# Patient Record
Sex: Male | Born: 1935
Health system: Southern US, Community
[De-identification: ages and names within clinical notes are randomized; demographics above are authoritative.]

## PROBLEM LIST (undated history)

## (undated) DIAGNOSIS — H9190 Unspecified hearing loss, unspecified ear: Secondary | ICD-10-CM

## (undated) DIAGNOSIS — M1990 Unspecified osteoarthritis, unspecified site: Secondary | ICD-10-CM

---

## 1998-05-14 ENCOUNTER — Emergency Department (HOSPITAL_COMMUNITY): Admission: EM | Admit: 1998-05-14 | Discharge: 1998-05-14 | Payer: Self-pay | Admitting: Emergency Medicine

## 2000-10-29 ENCOUNTER — Ambulatory Visit (HOSPITAL_COMMUNITY): Admission: RE | Admit: 2000-10-29 | Discharge: 2000-10-29 | Payer: Self-pay | Admitting: Orthopedic Surgery

## 2000-10-29 ENCOUNTER — Encounter: Payer: Self-pay | Admitting: Orthopedic Surgery

## 2003-12-09 ENCOUNTER — Emergency Department (HOSPITAL_COMMUNITY): Admission: EM | Admit: 2003-12-09 | Discharge: 2003-12-09 | Payer: Self-pay | Admitting: *Deleted

## 2005-04-10 ENCOUNTER — Ambulatory Visit (HOSPITAL_COMMUNITY): Admission: RE | Admit: 2005-04-10 | Discharge: 2005-04-10 | Payer: Self-pay | Admitting: Cardiovascular Disease

## 2005-04-11 ENCOUNTER — Emergency Department (HOSPITAL_COMMUNITY): Admission: EM | Admit: 2005-04-11 | Discharge: 2005-04-12 | Payer: Self-pay | Admitting: Emergency Medicine

## 2007-12-27 ENCOUNTER — Emergency Department (HOSPITAL_COMMUNITY): Admission: EM | Admit: 2007-12-27 | Discharge: 2007-12-27 | Payer: Self-pay | Admitting: Emergency Medicine

## 2008-07-01 ENCOUNTER — Ambulatory Visit (HOSPITAL_BASED_OUTPATIENT_CLINIC_OR_DEPARTMENT_OTHER): Admission: RE | Admit: 2008-07-01 | Discharge: 2008-07-01 | Payer: Self-pay | Admitting: Urology

## 2008-07-01 ENCOUNTER — Encounter (INDEPENDENT_AMBULATORY_CARE_PROVIDER_SITE_OTHER): Payer: Self-pay | Admitting: Urology

## 2011-04-06 NOTE — Op Note (Signed)
Thomas Rich, Thomas Rich       ACCOUNT NO.:  [id number]   MEDICAL RECORD NO.:  [id number]          PATIENT TYPE:  AMB   LOCATION:  [facility]                         FACILITY:  [facility]   PHYSICIAN:  Mark C. Vernie Ammons, M.D.  DATE OF BIRTH:  10/11/1936   DATE OF PROCEDURE:  07/01/2008  DATE OF DISCHARGE:                               OPERATIVE REPORT   PREOPERATIVE DIAGNOSIS:  Bladder lesion, and irritative voiding  symptoms.   POSTOPERATIVE DIAGNOSIS:  Bladder lesion, and irritative voiding  symptoms.   PROCEDURE:  Cystoscopy, barbotage of the bladder, and bladder biopsy.   SURGEON:  Mark C. Vernie Ammons, M.D.   ANESTHESIA:  General.   BLOOD LOSS:  Minimal.   DRAINS:  None.   SPECIMENS:  1. Cold cup biopsy from lesion on posterior wall.  2. Cold cup biopsy of trigonal region.  3. Cold cup biopsy of dome.  4. Barbotaged specimen from the bladder for cytology.   COMPLICATIONS:  None.   INDICATIONS:  The patient is a 75 year old [place] male who has been  having irritative voiding symptoms.  He was found to have some  microscopic hematuria the other day, and I therefore performed  cystoscopy and found what appeared to be a slightly erythematous region  on the posterior wall of the bladder just to the left of midline.  It  was not extremely worrisome but was somewhat suspicious, and with  irritative voiding symptoms I have suggested bladder biopsy and  discussed the procedure as well as its risks and complications of the  patient who understands and has elected to proceed.   DESCRIPTION OF OPERATION:  After informed consent, the patient was  brought to the major O.R. and placed on the table and administered  general anesthesia.  He was then moved to the dorsal lithotomy position,  and his genitalia was sterilely prepped and draped, after which an  official time-out was then performed.   The 22-French cystoscope with 12-degrees lens was then introduced per  urethra, and under direct  visualization was passed through the urethra.  The prostatic urethra revealed trilobar hypertrophy, with elongation of  the prostatic urethra, but no lesions were identified.  Upon entering  the bladder, I note 1+ trabeculation.  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  Both were visually inspected and noted be free of any  lesions.  I then inspected the bladder with both the 12 and 70-degrees  lenses.  The area of irritation was noted on the posterior wall again  and visualized but did not appear as worrisome as when I saw it in my  office.   With the cystoscope sheath placed at about the level of the bladder neck  or just inside, I emptied the bladder and then instilled sterile saline.  I then used a Toomey syringe to perform a barbotage of the bladder, and  this was sent for cytology.   I then inserted the cold cup biopsy forceps with a 12-degree lens and  obtained two cold cup biopsies from the suspicious area on the posterior  wall, and these were sent as a single specimen labeled posterior wall  biopsy.  I then obtained a biopsy from the dome and also from the  trigonal region.  These were sent separately as well.  I then used the  Bugbee electrode to fulgurate the biopsy sites, and no bleeding was  noted from these, and no perforation of the bladder was identified.  I  therefore drained the bladder and removed the cystoscope, and the  patient was awakened and taken to the recovery room in stable  satisfactory condition.  He tolerated the procedure well.  There were no  intraoperative complications.   He will be given a prescription for Pyridium Plus, #36, and Vicodin ES,  #24, will follow up in my office in 1 week.  Written instructions were  given upon discharge.      Mark C. Vernie Ammons, M.D.  Electronically Signed     MCO/MEDQ  D:  07/01/2008  T:  07/01/2008  Job:  [id number]

## 2011-08-13 LAB — CBC
MCHC: 34.6
MCV: 86.7
Platelets: 218
RDW: 13.2

## 2011-08-13 LAB — DIFFERENTIAL
Basophils Relative: 1
Eosinophils Absolute: 0.4
Neutrophils Relative %: 54

## 2011-08-13 LAB — BASIC METABOLIC PANEL
BUN: 10
CO2: 29
Calcium: 9.1
Chloride: 103
Creatinine, Ser: 1.02
GFR calc Af Amer: >60
GFR calc non Af Amer: >60
Glucose, Bld: 96
Potassium: 3.8
Sodium: 139

## 2011-08-20 LAB — POCT HEMOGLOBIN-HEMACUE: Hemoglobin: 14.8

## 2012-04-26 ENCOUNTER — Encounter (HOSPITAL_COMMUNITY): Payer: Self-pay

## 2012-04-26 ENCOUNTER — Emergency Department (HOSPITAL_COMMUNITY): Payer: Medicare Other

## 2012-04-26 ENCOUNTER — Inpatient Hospital Stay (HOSPITAL_COMMUNITY): Payer: Medicare Other

## 2012-04-26 ENCOUNTER — Inpatient Hospital Stay (HOSPITAL_COMMUNITY)
Admission: EM | Admit: 2012-04-26 | Discharge: 2012-04-27 | DRG: 690 | Disposition: A | Discharge status: Home / Self Care | Payer: Medicare Other | Attending: Internal Medicine | Admitting: Internal Medicine

## 2012-04-26 DIAGNOSIS — E876 Hypokalemia: Secondary | ICD-10-CM | POA: Diagnosis present

## 2012-04-26 DIAGNOSIS — L270 Generalized skin eruption due to drugs and medicaments taken internally: Secondary | ICD-10-CM | POA: Diagnosis not present

## 2012-04-26 DIAGNOSIS — I959 Hypotension, unspecified: Secondary | ICD-10-CM | POA: Diagnosis present

## 2012-04-26 DIAGNOSIS — N400 Enlarged prostate without lower urinary tract symptoms: Secondary | ICD-10-CM | POA: Diagnosis present

## 2012-04-26 DIAGNOSIS — M549 Dorsalgia, unspecified: Secondary | ICD-10-CM | POA: Diagnosis present

## 2012-04-26 DIAGNOSIS — N179 Acute kidney failure, unspecified: Secondary | ICD-10-CM

## 2012-04-26 DIAGNOSIS — J0100 Acute maxillary sinusitis, unspecified: Secondary | ICD-10-CM | POA: Diagnosis present

## 2012-04-26 DIAGNOSIS — J329 Chronic sinusitis, unspecified: Secondary | ICD-10-CM | POA: Diagnosis present

## 2012-04-26 DIAGNOSIS — T360X5A Adverse effect of penicillins, initial encounter: Secondary | ICD-10-CM | POA: Diagnosis not present

## 2012-04-26 DIAGNOSIS — R42 Dizziness and giddiness: Secondary | ICD-10-CM | POA: Diagnosis present

## 2012-04-26 DIAGNOSIS — N390 Urinary tract infection, site not specified: Principal | ICD-10-CM | POA: Diagnosis present

## 2012-04-26 LAB — URINALYSIS, ROUTINE W REFLEX MICROSCOPIC
Glucose, UA: NEGATIVE mg/dL
Hgb urine dipstick: NEGATIVE
Specific Gravity, Urine: 1.033 — ABNORMAL HIGH (ref 1.005–1.030)
pH: 5.5 (ref 5.0–8.0)

## 2012-04-26 LAB — DIFFERENTIAL
Basophils Absolute: 0.1 10*3/uL (ref 0.0–0.1)
Eosinophils Absolute: 0.2 10*3/uL (ref 0.0–0.7)
Eosinophils Relative: 4 % (ref 0–5)
Monocytes Absolute: 0.6 10*3/uL (ref 0.1–1.0)
Neutrophils Relative %: 64 % (ref 43–77)

## 2012-04-26 LAB — PROTIME-INR
INR: 1.02 (ref 0.00–1.49)
Prothrombin Time: 13.6 seconds (ref 11.6–15.2)

## 2012-04-26 LAB — COMPREHENSIVE METABOLIC PANEL
ALT: 14 U/L (ref 0–53)
AST: 27 U/L (ref 0–37)
Albumin: 3.6 g/dL (ref 3.5–5.2)
CO2: 24 mEq/L (ref 19–32)
Calcium: 9.5 mg/dL (ref 8.4–10.5)
Sodium: 132 mEq/L — ABNORMAL LOW (ref 135–145)
Total Protein: 7.4 g/dL (ref 6.0–8.3)

## 2012-04-26 LAB — URINE MICROSCOPIC-ADD ON

## 2012-04-26 LAB — CBC
MCH: 30.5 pg (ref 26.0–34.0)
Platelets: 195 10*3/uL (ref 150–400)
RBC: 5.38 MIL/uL (ref 4.22–5.81)
WBC: 5.7 10*3/uL (ref 4.0–10.5)

## 2012-04-26 MED ORDER — SODIUM CHLORIDE 0.9 % IV SOLN
INTRAVENOUS | Status: AC
  Administered 2012-04-26: 16:00:00 via INTRAVENOUS

## 2012-04-26 MED ORDER — DEXTROSE 5 % IV SOLN: 1.0000 g | INTRAVENOUS | Status: DC

## 2012-04-26 MED ORDER — SODIUM CHLORIDE 0.9 % IV BOLUS (SEPSIS)
1000.0000 mL | Freq: Once | INTRAVENOUS | Status: AC
  Administered 2012-04-26: 1000 mL via INTRAVENOUS

## 2012-04-26 MED ORDER — ENOXAPARIN SODIUM 40 MG/0.4ML ~~LOC~~ SOLN
40.0000 mg | SUBCUTANEOUS | Status: DC
  Administered 2012-04-26: 40 mg via SUBCUTANEOUS
  Filled 2012-04-26 (×2): qty 0.4

## 2012-04-26 MED ORDER — DIPHENHYDRAMINE HCL 25 MG PO CAPS
25.0000 mg | ORAL_CAPSULE | Freq: Once | ORAL | Status: AC
  Administered 2012-04-26: 25 mg via ORAL
  Filled 2012-04-26: qty 1

## 2012-04-26 MED ORDER — POTASSIUM CHLORIDE CRYS ER 20 MEQ PO TBCR
40.0000 meq | EXTENDED_RELEASE_TABLET | Freq: Once | ORAL | Status: AC
  Administered 2012-04-26: 40 meq via ORAL
  Filled 2012-04-26: qty 2

## 2012-04-26 MED ORDER — CIPROFLOXACIN IN D5W 400 MG/200ML IV SOLN
400.0000 mg | Freq: Two times a day (BID) | INTRAVENOUS | Status: DC
  Administered 2012-04-26: 400 mg via INTRAVENOUS
  Filled 2012-04-26 (×2): qty 200

## 2012-04-26 MED ORDER — AMOXICILLIN-POT CLAVULANATE 875-125 MG PO TABS
1.0000 | ORAL_TABLET | Freq: Two times a day (BID) | ORAL | Status: DC
  Administered 2012-04-26: 1 via ORAL
  Filled 2012-04-26: qty 1

## 2012-04-26 MED ORDER — POLYETHYLENE GLYCOL 3350 17 G PO PACK
17.0000 g | PACK | Freq: Every day | ORAL | Status: DC | PRN
  Filled 2012-04-26: qty 1

## 2012-04-26 MED ORDER — SODIUM CHLORIDE 0.9 % IV BOLUS (SEPSIS)
500.0000 mL | Freq: Once | INTRAVENOUS | Status: AC
  Administered 2012-04-26: 500 mL via INTRAVENOUS

## 2012-04-26 MED ORDER — DEXTROSE 5 % IV SOLN
1.0000 g | Freq: Once | INTRAVENOUS | Status: AC
  Administered 2012-04-26: 1 g via INTRAVENOUS
  Filled 2012-04-26: qty 10

## 2012-04-26 NOTE — H&P (Signed)
PCP: follows up with PCP in [place]   DOA:  04/26/2012  9:49 AM  Chief Complaint:  Dizziness for past  few days  HPI: 76 y/o [place] male with hx of BPH presents with dizziness for past few days. He informs being started on flomax by his urologist Dr Wynelle Link few weeks back. For past 2 weeks he has been feeling very dizzy on standing and walking. He felt lightheaded as well and unsteady but denies . He also had some frontal headache since yesterday. He denies any fever, chills, blurry vision, chest pain, palpitations, SOB, nausea or vomiting, denies tinnitus, weakness or extremity or numbness. informs of urinary urgency and frequency but no dysuria. Has been having continuation off and on. Denies change in weight or appetite. In the ED patient was noted to be hypotensive with SBP in 70s and given 1.5  NS. He was also noted to have AKI with low potassium . Head CT was done which was unremarkable except for chronic microvascular changes and acute rt maxillary sinusitis. Patient informs his dizziness to have improved after IV Fluids. He had similar symptoms few years back which self subsided. Has chronic upper and lower back pain and has been taking alleve OTC and was also prescribed vicodin by his PCP in past.  Allergies: No Known Allergies  Prior to Admission medications   Medication Sig Start Date End Date Taking? Authorizing Provider  naproxen sodium (ANAPROX) 220 MG tablet Take 220 mg by mouth 2 (two) times daily as needed. For pain.   Yes Historical Provider, MD  polyethylene glycol (MIRALAX / GLYCOLAX) packet Take 17 g by mouth daily as needed. For constipation.   Yes Historical Provider, MD  Tamsulosin HCl (FLOMAX) 0.4 MG CAPS Take 0.4 mg by mouth daily.   Yes Historical Provider, MD    Past medical history:  BPH  History reviewed. No pertinent past surgical history.  Social History:  reports that he has never smoked. He has never used smokeless tobacco. He reports that he does  not drink alcohol or use illicit drugs.  No family history on file.  Review of Systems:  Constitutional: Denies fever, chills, diaphoresis, appetite change and fatigue.  HEENT: Denies photophobia, eye pain, redness, hearing loss, ear pain, congestion, sore throat, rhinorrhea, sneezing, mouth sores, trouble swallowing, neck pain, neck stiffness and tinnitus.   Respiratory: Denies SOB, DOE, cough, chest tightness,  and wheezing.   Cardiovascular: Denies chest pain, palpitations and leg swelling.  Gastrointestinal: Denies nausea, vomiting, abdominal pain, diarrhea, constipation, blood in stool and abdominal distention.  Genitourinary: Denies dysuria, urgency, frequency, hematuria, flank pain and difficulty urinating.  Musculoskeletal: Denies myalgias, chronic upper and lower back pain,  joint swelling, arthralgias and gait problem.  Skin: Denies pallor, rash and wound.  Neurological:  dizziness, seizures, syncope, weakness, light-headedness, numbness, frontal headaches.  Hematological: Denies adenopathy. Easy bruising, personal or family bleeding history  Psychiatric/Behavioral: Denies suicidal ideation, mood changes, confusion, nervousness, sleep disturbance and agitation   Physical Exam:  Filed Vitals:   04/26/12 1015 04/26/12 1127 04/26/12 1317 04/26/12 1430  BP: 85/62 100/58 103/64 128/84  Pulse:  78 70 69  Temp:    97.5 F (36.4 C)  TempSrc:    Oral  Resp:  22 20 18   SpO2:  98% 97% 100%    Constitutional: Vital signs reviewed.  Patient is a well-developed and well-nourished in no acute distress and cooperative with exam. Alert and oriented x3.  Head: Normocephalic and atraumatic Ear: TM normal bilaterally  Mouth: no erythema or exudates, MMM Eyes: PERRL, EOMI, conjunctivae normal, No scleral icterus.  Neck: Supple, Trachea midline normal ROM, No JVD, mass, thyromegaly, or carotid bruit present.  Cardiovascular: RRR, S1 normal, S2 normal, no MRG, pulses symmetric and intact  bilaterally Pulmonary/Chest: CTAB, no wheezes, rales, or rhonchi Abdominal: Soft. Non-tender, non-distended, bowel sounds are normal, no masses, organomegaly, or guarding present.  GU: no CVA tenderness Musculoskeletal: No joint deformities, erythema, or stiffness, ROM full . Tender to palpation over mid cervical and lower lumbar spine Ext: no edema and no cyanosis, pulses palpable bilaterally (DP and PT) Hematology: no cervical, inginal, or axillary adenopathy.  Neurological: A&O x3, Strenght is normal and symmetric bilaterally, cranial nerve II-XII are grossly intact, no focal motor deficit, sensory intact to light touch bilaterally.  Skin: Warm, dry and intact. No rash, cyanosis, or clubbing.  Psychiatric: Normal mood and affect. speech and behavior is normal. Judgment and thought content normal. Cognition and memory are normal.   Labs on Admission:  Results for orders placed during the hospital encounter of 04/26/12 (from the past 48 hour(s))  CBC     Status: Normal   Collection Time   04/26/12 10:45 AM      Component Value Range Comment   WBC 5.7  4.0 - 10.5 (K/uL)    RBC 5.38  4.22 - 5.81 (MIL/uL)    Hemoglobin 16.4  13.0 - 17.0 (g/dL)    HCT 16.1  09.6 - 04.5 (%)    MCV 86.2  78.0 - 100.0 (fL)    MCH 30.5  26.0 - 34.0 (pg)    MCHC 35.3  30.0 - 36.0 (g/dL)    RDW 40.9  81.1 - 91.4 (%)    Platelets 195  150 - 400 (K/uL)   DIFFERENTIAL     Status: Normal   Collection Time   04/26/12 10:45 AM      Component Value Range Comment   Neutrophils Relative 64  43 - 77 (%)    Lymphocytes Relative 21  12 - 46 (%)    Monocytes Relative 10  3 - 12 (%)    Eosinophils Relative 4  0 - 5 (%)    Basophils Relative 1  0 - 1 (%)    Neutro Abs 3.6  1.7 - 7.7 (K/uL)    Lymphs Abs 1.2  0.7 - 4.0 (K/uL)    Monocytes Absolute 0.6  0.1 - 1.0 (K/uL)    Eosinophils Absolute 0.2  0.0 - 0.7 (K/uL)    Basophils Absolute 0.1  0.0 - 0.1 (K/uL)    WBC Morphology MILD LEFT SHIFT (1-5% METAS, OCC MYELO, OCC  BANDS)   ATYPICAL LYMPHOCYTES  COMPREHENSIVE METABOLIC PANEL     Status: Abnormal   Collection Time   04/26/12 10:45 AM      Component Value Range Comment   Sodium 132 (*) 135 - 145 (mEq/L)    Potassium 3.2 (*) 3.5 - 5.1 (mEq/L)    Chloride 95 (*) 96 - 112 (mEq/L)    CO2 24  19 - 32 (mEq/L)    Glucose, Bld 142 (*) 70 - 99 (mg/dL)    BUN 23  6 - 23 (mg/dL)    Creatinine, Ser 7.82 (*) 0.50 - 1.35 (mg/dL)    Calcium 9.5  8.4 - 10.5 (mg/dL)    Total Protein 7.4  6.0 - 8.3 (g/dL)    Albumin 3.6  3.5 - 5.2 (g/dL)    AST 27  0 - 37 (U/L)  ALT 14  0 - 53 (U/L)    Alkaline Phosphatase 58  39 - 117 (U/L)    Total Bilirubin 0.5  0.3 - 1.2 (mg/dL)    GFR calc non Af Amer 38 (*) >90 (mL/min)    GFR calc Af Amer 44 (*) >90 (mL/min)   TROPONIN I     Status: Normal   Collection Time   04/26/12 10:45 AM      Component Value Range Comment   Troponin I <0.30  <0.30 (ng/mL)   PROTIME-INR     Status: Normal   Collection Time   04/26/12 10:45 AM      Component Value Range Comment   Prothrombin Time 13.6  11.6 - 15.2 (seconds)    INR 1.02  0.00 - 1.49    URINALYSIS, ROUTINE W REFLEX MICROSCOPIC     Status: Abnormal   Collection Time   04/26/12 11:16 AM      Component Value Range Comment   Color, Urine AMBER (*) YELLOW  BIOCHEMICALS MAY BE AFFECTED BY COLOR   APPearance CLOUDY (*) CLEAR     Specific Gravity, Urine 1.033 (*) 1.005 - 1.030     pH 5.5  5.0 - 8.0     Glucose, UA NEGATIVE  NEGATIVE (mg/dL)    Hgb urine dipstick NEGATIVE  NEGATIVE     Bilirubin Urine SMALL (*) NEGATIVE     Ketones, ur TRACE (*) NEGATIVE (mg/dL)    Protein, ur 30 (*) NEGATIVE (mg/dL)    Urobilinogen, UA 0.2  0.0 - 1.0 (mg/dL)    Nitrite NEGATIVE  NEGATIVE     Leukocytes, UA NEGATIVE  NEGATIVE    URINE MICROSCOPIC-ADD ON     Status: Abnormal   Collection Time   04/26/12 11:16 AM      Component Value Range Comment   Squamous Epithelial / LPF RARE  RARE     WBC, UA 3-6  <3 (WBC/hpf)    RBC / HPF 0-2  <3 (RBC/hpf)     Bacteria, UA MANY (*) RARE     Casts HYALINE CASTS (*) NEGATIVE  GRANULAR CAST    Radiological Exams on Admission: CT head( 06/05)  IMPRESSION:  No acute intracranial abnormality.  Atrophy, chronic microvascular disease.  Air-fluid level in the right maxillary sinus suggesting acute  sinusitis.    Assessment/Plan 76 y/o male with hx of BPH presents with few days fo dizziness with lightheadedness. patient noted to have low BP on presentations with AKI.    *Dizziness - light-headed Admit to medicine Patient given about 1500 cc IVF in  ED with improvement in BP Symptoms possibly related to dehydration/ UTI Orthostatics checked in ED was negative continue IV Fluids Will get PT eval Patient ahs had few ED visits in past 5 years for dizziness and syncope. MRI brain done back in 06 was unremarkable.  also c/o pain over  cervical and lumbar spine area. X ray of lumbar spine done showing chronic diffuse spondylosis. Will get cervical spine x ray .  Hypotension  Improved with IV fluids Monitor for now  AKI  possibly prerenal. Patient informs taking few ( 2-3 tablets daily) alleve  for back pain as well  no recent labs in the system  will monitor following IV hydration Avoid nephrotoxins  Hypokalemia  will replenish with kcl. Monitor in am   BPH (benign prostatic hyperplasia) Patient was on flomax ( started  Few weeks back) but held by his urologist as he was complaining of dizziness.  acute Sinusitis Will treat with po Augmentin   UTI (lower urinary tract infection) Urine culture sent  patient c/o increased urinary frequency and hesitancy and has ben seeing Dr Wynelle Link  cont IV ceftriaxone. Follow cx   Back pain Prn tylenol  DVT prophylaxis  Diet: regular  Full code    Time Spent on Admission: 60 minutes  Faydra Korman 04/26/2012, 3:39 PM

## 2012-04-26 NOTE — ED Notes (Signed)
Pt reports headache x 2 weeks. Dizziness with standing/walking with fall. Reports pain in back of neck.

## 2012-04-26 NOTE — ED Provider Notes (Addendum)
History     CSN: [id number]  Arrival date & time 04/26/12  0947   First MD Initiated Contact with Patient 04/26/12 1013      No chief complaint on file.  Weakness, falls (Consider location/radiation/quality/duration/timing/severity/associated sxs/prior treatment) HPI  Patient weak and fell three times this a.m.  Patient denies focal weakness and is lightheaded.  No history of fever or rectal bleeding. Patient with headache for two weeks.  He saw urologist two days ago and had new medicine started.  Patient endorses some hesitancy and frequency of urination.  Decrease po intake but no nausea, vomiting, diarrhea, chest pain, or abdominal pain.    No past medical history on file.  No past surgical history on file.  No family history on file.  History  Substance Use Topics  . Smoking status: Not on file  . Smokeless tobacco: Not on file  . Alcohol Use: Not on file      Review of Systems  All other systems reviewed and are negative.    Allergies  Review of patient's allergies indicates no known allergies.  Home Medications   Current Outpatient Rx  Name Route Sig Dispense Refill  . NAPROXEN SODIUM 220 MG PO TABS Oral Take 220 mg by mouth 2 (two) times daily as needed. For pain.    Marland Kitchen POLYETHYLENE GLYCOL 3350 PO PACK Oral Take 17 g by mouth daily as needed. For constipation.    Marland Kitchen TAMSULOSIN HCL 0.4 MG PO CAPS Oral Take 0.4 mg by mouth daily.      BP 100/58  Pulse 78  Temp(Src) 97.4 F (36.3 C) (Oral)  Resp 22  SpO2 98%  Physical Exam  Nursing note and vitals reviewed. Constitutional: He is oriented to person, place, and time. He appears well-developed and well-nourished.  HENT:  Head: Normocephalic and atraumatic.  Right Ear: External ear normal.  Left Ear: External ear normal.  Mouth/Throat: Oropharynx is clear and moist.  Eyes: Conjunctivae and EOM are normal. Pupils are equal, round, and reactive to light.  Neck: Normal range of motion.  Cardiovascular:  Normal rate, regular rhythm and normal heart sounds.   Pulmonary/Chest: Effort normal and breath sounds normal.  Abdominal: Soft. Bowel sounds are normal.  Musculoskeletal: Normal range of motion.  Neurological: He is alert and oriented to person, place, and time. He has normal strength. No sensory deficit. He displays a negative Romberg sign. GCS eye subscore is 4. GCS verbal subscore is 5. GCS motor subscore is 6.    ED Course  Procedures (including critical care time)  Labs Reviewed  COMPREHENSIVE METABOLIC PANEL - Abnormal; Notable for the following:    Sodium 132 (*)    Potassium 3.2 (*)    Chloride 95 (*)    Glucose, Bld 142 (*)    Creatinine, Ser 1.71 (*)    GFR calc non Af Amer 38 (*)    GFR calc Af Amer 44 (*)    All other components within normal limits  CBC  DIFFERENTIAL  TROPONIN I  PROTIME-INR  CULTURE, BLOOD (ROUTINE X 2)  CULTURE, BLOOD (ROUTINE X 2)  URINALYSIS, ROUTINE W REFLEX MICROSCOPIC   No results found.   No diagnosis found.   Date: 04/26/2012  Rate: 79  Rhythm: normal sinus rhythm  QRS Axis: left  Intervals: normal  ST/T Wave abnormalities: nonspecific ST changes  Conduction Disutrbances:left anterior fascicular block  Narrative Interpretation:   Old EKG Reviewed: none available    MDM     Patient with hypotension  on initial evaluation. Blood pressure has improved with fluid bolus of 1500 cc. Patient appears to have sinusitis and urinary tract infection. He is treated here with Rocephin. His not appear to be septic. He does appear to have some dehydration. He needs to have continued fluid resuscitation and IV antibiotics. I will consult with the hospitalist to have the patient admitted.  The patient's care discussed with Dr. Gonzella Lex And patient will be admitted to team 5 medsurge.    CRITICAL CARE Performed by: Hilario Quarry   Total critical care time: 30  Critical care time was exclusive of separately billable procedures and  treating other patients.  Critical care was necessary to treat or prevent imminent or life-threatening deterioration.  Critical care was time spent personally by me on the following activities: development of treatment plan with patient and/or surrogate as well as nursing, discussions with consultants, evaluation of patient's response to treatment, examination of patient, obtaining history from patient or surrogate, ordering and performing treatments and interventions, ordering and review of laboratory studies, ordering and review of radiographic studies, pulse oximetry and re-evaluation of patient's condition.  Hilario Quarry, MD 04/26/12 1259  Hilario Quarry, MD 04/26/12 1300

## 2012-04-26 NOTE — Plan of Care (Signed)
Problem: Consults Goal: Skin Care Protocol Initiated - if indicated If consults are not indicated, leave blank or document N/A Outcome: Completed/Met Date Met:  04/26/12 Pt with moderate itching. MD notified. Med ordered.

## 2012-04-27 DIAGNOSIS — N179 Acute kidney failure, unspecified: Secondary | ICD-10-CM

## 2012-04-27 DIAGNOSIS — R42 Dizziness and giddiness: Secondary | ICD-10-CM

## 2012-04-27 DIAGNOSIS — E876 Hypokalemia: Secondary | ICD-10-CM

## 2012-04-27 DIAGNOSIS — E782 Mixed hyperlipidemia: Secondary | ICD-10-CM

## 2012-04-27 LAB — BASIC METABOLIC PANEL
BUN: 16 mg/dL (ref 6–23)
CO2: 27 mEq/L (ref 19–32)
Chloride: 100 mEq/L (ref 96–112)
Creatinine, Ser: 1.32 mg/dL (ref 0.50–1.35)
Glucose, Bld: 99 mg/dL (ref 70–99)
Potassium: 3.7 mEq/L (ref 3.5–5.1)

## 2012-04-27 MED ORDER — DIPHENHYDRAMINE HCL 25 MG PO TABS: 25.0000 mg | ORAL_TABLET | Freq: Four times a day (QID) | ORAL | Status: DC | PRN

## 2012-04-27 MED ORDER — LEVOFLOXACIN 500 MG PO TABS
500.0000 mg | ORAL_TABLET | Freq: Every day | ORAL | Status: DC
  Administered 2012-04-27: 500 mg via ORAL
  Filled 2012-04-27: qty 1

## 2012-04-27 MED ORDER — AZITHROMYCIN 250 MG PO TABS
250.0000 mg | ORAL_TABLET | Freq: Every day | ORAL | Status: DC
  Filled 2012-04-27: qty 1

## 2012-04-27 MED ORDER — DEXTROSE 5 % IV SOLN
1.0000 g | Freq: Once | INTRAVENOUS | Status: AC
  Administered 2012-04-27: 1 g via INTRAVENOUS
  Filled 2012-04-27: qty 10

## 2012-04-27 MED ORDER — DIPHENHYDRAMINE HCL 25 MG PO CAPS
25.0000 mg | ORAL_CAPSULE | Freq: Once | ORAL | Status: AC
  Administered 2012-04-27: 25 mg via ORAL
  Filled 2012-04-27: qty 1

## 2012-04-27 MED ORDER — LEVOFLOXACIN 500 MG PO TABS: 500.0000 mg | ORAL_TABLET | Freq: Every day | ORAL | Status: AC

## 2012-04-27 MED ORDER — AZITHROMYCIN 500 MG PO TABS
500.0000 mg | ORAL_TABLET | ORAL | Status: DC
  Administered 2012-04-27: 500 mg via ORAL
  Filled 2012-04-27: qty 1

## 2012-04-27 NOTE — Progress Notes (Signed)
PT Cancellation Note  Treatment cancelled today due to pt reports he is back to normal and is independent in gait.  Ebony Hail [facility] 04/27/2012, 10:56 AM

## 2012-04-27 NOTE — Discharge Summary (Signed)
Patient ID: Thomas Rich MRN: [id number] DOB/AGE: Jun 10, 1936 76 y.o.  Admit date: 04/26/2012 Discharge date: 04/27/2012  Primary Care Physician:  Follows at [facility]   Discharge Diagnoses:     Principal Problem:  *Dizziness - light-headed  Active Problems:  BPH (benign prostatic hyperplasia)  Hypotension  Acute kidney injury  Sinusitis  UTI (lower urinary tract infection)  Hypokalemia degenrative joint disease of spine    Medication List  As of 04/27/2012  1:23 PM   STOP taking these medications         naproxen sodium 220 MG tablet      Tamsulosin HCl 0.4 MG Caps         TAKE these medications         diphenhydrAMINE 25 MG tablet   Commonly known as: BENADRYL   Take 1 tablet (25 mg total) by mouth every 6 (six) hours as needed for itching.      levofloxacin 500 MG tablet   Commonly known as: LEVAQUIN   Take 1 tablet (500 mg total) by mouth daily.      polyethylene glycol packet   Commonly known as: MIRALAX / GLYCOLAX   Take 17 g by mouth daily as needed. For constipation.            Disposition and Follow-up: Home with outpatient follow up with his urologist as scheduled.  Consults:  none  Significant Diagnostic Studies:  Dg Chest 2 View  04/26/2012  *RADIOLOGY REPORT*  Clinical Data: Dizziness, shortness of breath, hypotension.  CHEST - 2 VIEW  Comparison: 04/11/2005  Findings: There is hyperinflation of the lungs compatible with COPD.  Heart and mediastinal contours are within normal limits.  No focal opacities or effusions.  No acute bony abnormality.  IMPRESSION: No active cardiopulmonary disease.  Original Report Authenticated By: Cyndie Chime, M.D.   Dg Cervical Spine Complete  04/26/2012  *RADIOLOGY REPORT*  Clinical Data: Posterior neck pain.  No history of injury.  CERVICAL SPINE - COMPLETE 4+ VIEW  Comparison: No priors.  Findings: Alignment is anatomic.  Prevertebral soft tissues are normal.  No acute displaced fractures of  the cervical spine are appreciated.  There is multilevel degenerative disc disease, most severe at C4-C5, C5-C6 and C6-C7.  Multilevel facet arthropathy is also noted throughout the cervical spine.  A calcification is seen posterior to the C6-C7 interspace adjacent to the tip of the spinous process of C7, likely represents a dystrophic calcification from remote trauma.  IMPRESSION: 1.  No acute radiographic abnormality of the cervical spine to account for patient's symptoms. 2.  Multilevel degenerative disc disease and cervical spondylosis, as above. 3.  Calcification in the posterior paraspinal soft tissues is likely a dystrophic calcification related to remote trauma.  Original Report Authenticated By: Florencia Reasons, M.D.   Dg Lumbar Spine Complete  04/26/2012  *RADIOLOGY REPORT*  Clinical Data: Fall, low back pain.  LUMBAR SPINE - COMPLETE 4+ VIEW  Comparison: CT of the abdomen and pelvis 04/03/2012  Findings: Diffuse degenerative disc disease and facet disease.  7 mm of anterolisthesis of L5 on S1, related to pars defects and facet disease, stable since prior CT.  No fracture or acute subluxation.  SI joints are symmetric and unremarkable.  IMPRESSION: Diffuse spondylosis.  Bilateral L5 pars defects with grade 1 anterolisthesis of L5 on S1.  No acute findings.  Original Report Authenticated By: Cyndie Chime, M.D.   Ct Head Wo Contrast  04/26/2012  *RADIOLOGY REPORT*  Clinical  Data: Headache.  Dizziness, unsteady gait.  CT HEAD WITHOUT CONTRAST  Technique:  Contiguous axial images were obtained from the base of the skull through the vertex without contrast.  Comparison: MRI 04/10/2005  Findings: There is atrophy and chronic small vessel disease changes. No acute intracranial abnormality.  Specifically, no hemorrhage, hydrocephalus, mass lesion, acute infarction, or significant intracranial injury.  No acute calvarial abnormality.  Air-fluid level partially imaged in the right maxillary sinus. Remainder  of the paranasal sinuses and mastoids are clear.  IMPRESSION: No acute intracranial abnormality.  Atrophy, chronic microvascular disease.  Air-fluid level in the right maxillary sinus suggesting acute sinusitis.  Original Report Authenticated By: Cyndie Chime, M.D.    Brief H and P: For complete details please refer to admission H and P, but in brief  76 y/o [place] male with hx of BPH presents with dizziness for past few days. He informs being started on flomax by his urologist Dr Wynelle Link few weeks back. For past 2 weeks he has been feeling very dizzy on standing and walking. He felt lightheaded as well and unsteady but denies . He also had some frontal headache since yesterday. He denies any fever, chills, blurry vision, chest pain, palpitations, SOB, nausea or vomiting, denies tinnitus, weakness or extremity or numbness. informs of urinary urgency and frequency but no dysuria. Has been having continuation off and on. Denies change in weight or appetite.  In the ED patient was noted to be hypotensive with SBP in 70s and given 1.5 NS. He was also noted to have AKI with low potassium . Head CT was done which was unremarkable except for chronic microvascular changes and acute rt maxillary sinusitis.  Patient informs his dizziness to have improved after IV Fluids. He had similar symptoms few years back which self subsided.   Physical Exam on Discharge:  Filed Vitals:   04/26/12 1430 04/26/12 1617 04/26/12 2027 04/27/12 0535  BP: 128/84  112/74 130/75  Pulse: 69  71 63  Temp: 97.5 F (36.4 C)  98.4 F (36.9 C) 97.9 F (36.6 C)  TempSrc: Oral  Oral Oral  Resp: 18  17 16   Height:  5\' 11"  (1.803 m)    Weight:  81.466 kg (179 lb 9.6 oz)    SpO2: 100%  95% 93%     Intake/Output Summary (Last 24 hours) at 04/27/12 1323 Last data filed at 04/27/12 0900  Gross per 24 hour  Intake 1894.49 ml  Output      0 ml  Net 1894.49 ml    General: Alert, awake, oriented x3, in no acute  distress. HEENT: No bruits, no goiter. Heart: Regular rate and rhythm, without murmurs, rubs, gallops. Lungs: Clear to auscultation bilaterally. Abdomen: Soft, nontender, nondistended, positive bowel sounds. Maculopapular rash over abdomen , back and upper arms now resolved. Extremities: No clubbing cyanosis or edema with positive pedal pulses. Neuro: Grossly intact, nonfocal.  CBC:    Component Value Date/Time   WBC 5.7 04/26/2012 1045   HGB 16.4 04/26/2012 1045   HCT 46.4 04/26/2012 1045   PLT 195 04/26/2012 1045   MCV 86.2 04/26/2012 1045   NEUTROABS 3.6 04/26/2012 1045   LYMPHSABS 1.2 04/26/2012 1045   MONOABS 0.6 04/26/2012 1045   EOSABS 0.2 04/26/2012 1045   BASOSABS 0.1 04/26/2012 1045    Basic Metabolic Panel:    Component Value Date/Time   NA 134* 04/27/2012 0330   K 3.7 04/27/2012 0330   CL 100 04/27/2012 0330   CO2 27  04/27/2012 0330   BUN 16 04/27/2012 0330   CREATININE 1.32 04/27/2012 0330   GLUCOSE 99 04/27/2012 0330   CALCIUM 8.7 04/27/2012 0330    Hospital Course:   *Dizziness - light-headed with hypotension Likely in the setting of dehydration/ UTI Patient given about 1500 cc IVF in ED with improvement in BP  Orthostatics checked in ED was negative  continue IV Fluids  Patient has had few ED visits in past 5 years for dizziness and syncope. MRI brain done back in 06 was unremarkable.  Patient's symptoms resolved with IV fluids   AKI  Likely  prerenal. Patient informs taking few ( 2-3 tablets daily) alleve for back pain as well  no recent labs in the system  Renal function improved with hydration Avoid nephrotoxins   Hypokalemia  replenished with kcl.   BPH (benign prostatic hyperplasia)  Patient was on flomax ( started Few weeks back) but held by his urologist as he was complaining of dizziness.   acute maxillary Sinusitis  patient complaining of some headache with dizziness and CT head shows right acute maxillary sinusitis Was started on Augmentin on admission but broke into  diffuse rash. Now improved with po benadryl. Will switch abx to po Levaquin to cover for both UTI and sinusitis for a 10 day course  UTI (lower urinary tract infection)  Urine culture sent  patient c/o increased urinary frequency and hesitancy and has ben seeing Dr Wynelle Link  Will be discharged on po levofloxacin to complete a 10 day course  Rash  maculopapular diffusely over the abdomen a, back and UE soon after being started on Augmentin. dced and switched to quinolones. No hx of PCN allergy.  Patient clinically stable and can be discharged home. He follow sup at [facility] as outpt and will Dr Wynelle Link  Time spent on Discharge: 45 minutes  Signed: Eddie North 04/27/2012, 1:23 PM

## 2012-04-27 NOTE — Progress Notes (Signed)
Patient broke out in red rash scattered over his entire body with raised bumps that were very itchy.  Pt a&o and VS WNL.  Notified MD.  Received new orders.  Will continue to monitor pt. Daphene Calamity [place] 12:52 AM 04/27/2012

## 2012-04-28 LAB — URINE CULTURE: Colony Count: 6000

## 2012-04-30 ENCOUNTER — Encounter (HOSPITAL_COMMUNITY): Payer: Self-pay | Admitting: Emergency Medicine

## 2012-04-30 ENCOUNTER — Emergency Department (HOSPITAL_COMMUNITY)
Admission: EM | Admit: 2012-04-30 | Discharge: 2012-04-30 | Disposition: A | Discharge status: Home / Self Care | Payer: Medicare Other | Attending: Emergency Medicine | Admitting: Emergency Medicine

## 2012-04-30 DIAGNOSIS — Z9181 History of falling: Secondary | ICD-10-CM | POA: Insufficient documentation

## 2012-04-30 DIAGNOSIS — M5030 Other cervical disc degeneration, unspecified cervical region: Secondary | ICD-10-CM

## 2012-04-30 HISTORY — DX: Unspecified osteoarthritis, unspecified site: M19.90

## 2012-04-30 MED ORDER — MELOXICAM 7.5 MG PO TABS: 7.5000 mg | ORAL_TABLET | Freq: Every day | ORAL | Status: AC

## 2012-04-30 MED ORDER — ACETAMINOPHEN 325 MG PO TABS
650.0000 mg | ORAL_TABLET | Freq: Once | ORAL | Status: AC
  Administered 2012-04-30: 650 mg via ORAL
  Filled 2012-04-30: qty 2

## 2012-04-30 NOTE — ED Provider Notes (Signed)
History     CSN: [id number]  Arrival date & time 04/30/12  0203   First MD Initiated Contact with Patient 04/30/12 0533      Chief Complaint  Patient presents with  . Neck Pain    (Consider location/radiation/quality/duration/timing/severity/associated sxs/prior treatment) HPI Comments: Patient was seen here 4 days ago for the same complaint.  X-ray of the C-spine revealed in the disc disease.  He presents tonight after sleeping for several hours with neck pain is unchanged.  He has not taken any over-the-counter medication for his discomfort  Patient is a 76 y.o. male presenting with neck pain. The history is provided by the patient.  Neck Pain  This is a recurrent problem. The problem occurs constantly. The problem has not changed since onset.The pain is associated with a fall. Pertinent negatives include no numbness and no weakness.    Past Medical History  Diagnosis Date  . Arthritis     History reviewed. No pertinent past surgical history.  History reviewed. No pertinent family history.  History  Substance Use Topics  . Smoking status: Never Smoker   . Smokeless tobacco: Never Used  . Alcohol Use: No      Review of Systems  Constitutional: Negative for fever.  HENT: Positive for neck pain. Negative for congestion.   Eyes: Negative for visual disturbance.  Gastrointestinal: Negative for nausea.  Skin: Negative for wound.  Neurological: Negative for dizziness, weakness and numbness.    Allergies  Augmentin  Home Medications   Current Outpatient Rx  Name Route Sig Dispense Refill  . DIPHENHYDRAMINE HCL 25 MG PO TABS Oral Take 1 tablet (25 mg total) by mouth every 6 (six) hours as needed for itching. 10 tablet 0  . LEVOFLOXACIN 500 MG PO TABS Oral Take 1 tablet (500 mg total) by mouth daily. 9 tablet 0  . POLYETHYLENE GLYCOL 3350 PO PACK Oral Take 17 g by mouth daily as needed. For constipation.      BP 125/66  Pulse 83  Temp(Src) 97.4 F (36.3 C)  (Oral)  Resp 18  SpO2 96%  Physical Exam  Constitutional: He appears well-developed.  HENT:  Head: Normocephalic.  Eyes: Pupils are equal, round, and reactive to light.  Neck: Normal range of motion. Muscular tenderness present. No spinous process tenderness present. No erythema and normal range of motion present.      ED Course  Procedures (including critical care time)  Labs Reviewed - No data to display No results found.   No diagnosis found.    MDM  Recurrent muscular tenderness to right posterior neck with a normal cervical spine series, 3, days ago, showing degenerative disc disease        Arman Filter, NP 04/30/12 [phone]

## 2012-04-30 NOTE — ED Notes (Signed)
Patient presents with c/o right side neck pain upon palpation. Patient sts that his neck doesn't hurt unless he presses in on the right side of his neck. Moving side to side and up and down is not painful.

## 2012-04-30 NOTE — ED Provider Notes (Signed)
Medical screening examination/treatment/procedure(s) were performed by non-physician practitioner and as supervising physician I was immediately available for consultation/collaboration.   Lyanne Co, MD 04/30/12 [phone]

## 2012-04-30 NOTE — ED Notes (Signed)
Patient given discharge instructions, information, prescriptions, and diet order. Patient states that they adequately understand discharge information given and to return to ED if symptoms return or worsen.     

## 2012-04-30 NOTE — Discharge Instructions (Signed)
Degenerative Disc Disease Degenerative disc disease is a condition caused by the changes that occur in the cushions of the backbone (spinal discs) as you grow older. Spinal discs are soft and compressible discs located between the bones of the spine (vertebrae). They act like shock absorbers. Degenerative disc disease can affect the wholespine. However, the neck and lower back are most commonly affected. Many changes can occur in the spinal discs with aging, such as:  The spinal discs may dry and shrink.   Small tears may occur in the tough, outer covering of the disc (annulus).   The disc space may become smaller due to loss of water.   Abnormal growths in the bone (spurs) may occur. This can put pressure on the nerve roots exiting the spinal canal, causing pain.   The spinal canal may become narrowed.  CAUSES  Degenerative disc disease is a condition caused by the changes that occur in the spinal discs with aging. The exact cause is not known, but there is a genetic basis for many patients. Degenerative changes can occur due to loss of fluid in the disc. This makes the disc thinner and reduces the space between the backbones. Small cracks can develop in the outer layer of the disc. This can lead to the breakdown of the disc. You are more likely to get degenerative disc disease if you are overweight. Smoking cigarettes and doing heavy work such as weightlifting can also increase your risk of this condition. Degenerative changes can start after a sudden injury. Growth of bone spurs can compress the nerve roots and cause pain.  SYMPTOMS  The symptoms vary from person to person. Some people may have no pain, while others have severe pain. The pain may be so severe that it can limit your activities. The location of the pain depends on the part of your backbone that is affected. You will have neck or arm pain if a disc in the neck area is affected. You will have pain in your back, buttocks, or legs if a  disc in the lower back is affected. The pain becomes worse while bending, reaching up, or with twisting movements. The pain may start gradually and then get worse as time passes. It may also start after a major or minor injury. You may feel numbness or tingling in the arms or legs.  DIAGNOSIS  Your caregiver will ask you about your symptoms and about activities or habits that may cause the pain. He or she may also ask about any injuries, diseases, ortreatments you have had earlier. Your caregiver will examine you to check for the range of movement that is possible in the affected area, to check for strength in your extremities, and to check for sensation in the areas of the arms and legs supplied by different nerve roots. An X-ray of the spine may be taken. Your caregiver may suggest other imaging tests, such as a computerized magnetic scan (MRI), if needed.  TREATMENT  Treatment includes rest, modifying your activities, and applying ice and heat. Your caregiver may prescribe medicines to reduce your pain and may ask you to do some exercises to strengthen your back. In some cases, you may need surgery. You and your caregiver will decide on the treatment that is best for you. HOME CARE INSTRUCTIONS   Follow proper lifting and walking techniques as advised by your caregiver.   Maintain good posture.   Exercise regularly as advised.   Perform relaxation exercises.   Change your sitting,   standing, and sleeping habits as advised. Change positions frequently.   Lose weight as advised.   Stop smoking if you smoke.   Wear supportive footwear.  SEEK MEDICAL CARE IF:  The pain does not go away within 1 to 4 weeks. SEEK IMMEDIATE MEDICAL CARE IF:   The pain is severe.   You notice weakness in your arms, hands, or legs.   You begin to lose control of your bladder or bowel.  MAKE SURE YOU:   Understand these instructions.   Will watch your condition.   Will get help right away if you are not  doing well or get worse.  Document Released: 09/05/2007 Document Revised: 10/28/2011 Document Reviewed: 09/05/2007 [facility] Patient Information 2012 [place], [place]. Your given a medication to help with your degenerative disc disease, discomfort.  Please make an appointment with Dr. Dion Saucier for further evaluation

## 2012-04-30 NOTE — ED Notes (Addendum)
Pt report pain in posterior neck, to right of spine, worse with palpation. No known cause, no past history. No other concerns.

## 2012-05-03 LAB — CULTURE, BLOOD (ROUTINE X 2)
Culture  Setup Time: 201306060143
Culture: NO GROWTH

## 2015-12-16 ENCOUNTER — Emergency Department (HOSPITAL_COMMUNITY)
Admission: EM | Admit: 2015-12-16 | Discharge: 2015-12-16 | Disposition: A | Discharge status: Home / Self Care | Payer: Medicare Other | Attending: Emergency Medicine | Admitting: Emergency Medicine

## 2015-12-16 ENCOUNTER — Encounter (HOSPITAL_COMMUNITY): Payer: Self-pay

## 2015-12-16 DIAGNOSIS — N5089 Other specified disorders of the male genital organs: Secondary | ICD-10-CM | POA: Diagnosis present

## 2015-12-16 DIAGNOSIS — M1990 Unspecified osteoarthritis, unspecified site: Secondary | ICD-10-CM | POA: Diagnosis not present

## 2015-12-16 DIAGNOSIS — Z79899 Other long term (current) drug therapy: Secondary | ICD-10-CM | POA: Insufficient documentation

## 2015-12-16 DIAGNOSIS — L309 Dermatitis, unspecified: Secondary | ICD-10-CM | POA: Diagnosis not present

## 2015-12-16 DIAGNOSIS — Z792 Long term (current) use of antibiotics: Secondary | ICD-10-CM | POA: Diagnosis not present

## 2015-12-16 DIAGNOSIS — R350 Frequency of micturition: Secondary | ICD-10-CM | POA: Diagnosis not present

## 2015-12-16 DIAGNOSIS — K5900 Constipation, unspecified: Secondary | ICD-10-CM | POA: Insufficient documentation

## 2015-12-16 DIAGNOSIS — Z7982 Long term (current) use of aspirin: Secondary | ICD-10-CM | POA: Diagnosis not present

## 2015-12-16 LAB — CBC WITH DIFFERENTIAL/PLATELET
Basophils Absolute: 0.1 10*3/uL (ref 0.0–0.1)
Basophils Relative: 1 %
EOS ABS: 0.3 10*3/uL (ref 0.0–0.7)
Eosinophils Relative: 4 %
HEMATOCRIT: 47.2 % (ref 39.0–52.0)
HEMOGLOBIN: 16.5 g/dL (ref 13.0–17.0)
LYMPHS ABS: 1.3 10*3/uL (ref 0.7–4.0)
Lymphocytes Relative: 18 %
MCH: 31 pg (ref 26.0–34.0)
MCHC: 35 g/dL (ref 30.0–36.0)
MCV: 88.6 fL (ref 78.0–100.0)
MONOS PCT: 9 %
Monocytes Absolute: 0.7 10*3/uL (ref 0.1–1.0)
NEUTROS PCT: 68 %
Neutro Abs: 5 10*3/uL (ref 1.7–7.7)
PLATELETS: 215 10*3/uL (ref 150–400)
RBC: 5.33 MIL/uL (ref 4.22–5.81)
RDW: 13 % (ref 11.5–15.5)
WBC: 7.4 10*3/uL (ref 4.0–10.5)

## 2015-12-16 LAB — BASIC METABOLIC PANEL
ANION GAP: 9 (ref 5–15)
BUN: 8 mg/dL (ref 6–20)
CALCIUM: 9.3 mg/dL (ref 8.9–10.3)
CO2: 28 mmol/L (ref 22–32)
CREATININE: 1.11 mg/dL (ref 0.61–1.24)
Chloride: 104 mmol/L (ref 101–111)
Glucose, Bld: 102 mg/dL — ABNORMAL HIGH (ref 65–99)
Potassium: 4.2 mmol/L (ref 3.5–5.1)
SODIUM: 141 mmol/L (ref 135–145)

## 2015-12-16 LAB — URINALYSIS, ROUTINE W REFLEX MICROSCOPIC
Bilirubin Urine: NEGATIVE
Glucose, UA: NEGATIVE mg/dL
Hgb urine dipstick: NEGATIVE
Ketones, ur: NEGATIVE mg/dL
LEUKOCYTES UA: NEGATIVE
NITRITE: NEGATIVE
PROTEIN: NEGATIVE mg/dL
SPECIFIC GRAVITY, URINE: 1.015 (ref 1.005–1.030)
pH: 6.5 (ref 5.0–8.0)

## 2015-12-16 MED ORDER — TRIAMCINOLONE ACETONIDE 0.1 % EX CREA: 1.0000 "application " | TOPICAL_CREAM | Freq: Two times a day (BID) | CUTANEOUS | Status: DC

## 2015-12-16 MED ORDER — SULFAMETHOXAZOLE-TRIMETHOPRIM 800-160 MG PO TABS: 1.0000 | ORAL_TABLET | Freq: Two times a day (BID) | ORAL | Status: AC

## 2015-12-16 MED ORDER — SULFAMETHOXAZOLE-TRIMETHOPRIM 800-160 MG PO TABS
1.0000 | ORAL_TABLET | Freq: Once | ORAL | Status: AC
Start: 2015-12-16 — End: 2015-12-16
  Administered 2015-12-16: 1 via ORAL
  Filled 2015-12-16: qty 1

## 2015-12-16 MED ORDER — TRIAMCINOLONE ACETONIDE 0.1 % EX CREA
TOPICAL_CREAM | Freq: Two times a day (BID) | CUTANEOUS | Status: DC
  Administered 2015-12-16: 12:00:00 via TOPICAL
  Filled 2015-12-16: qty 15

## 2015-12-16 NOTE — ED Provider Notes (Signed)
CSN: [id number]     Arrival date & time 12/16/15  0820 History   First MD Initiated Contact with Patient 12/16/15 0831     Chief Complaint  Patient presents with  . Constipation  . scrotal problem      (Consider location/radiation/quality/duration/timing/severity/associated sxs/prior Treatment) Patient is a 80 y.o. male presenting with constipation.  Constipation .... Patient is concerned about irritation in his scrotal sac for approximately one week.. Symptoms appear to be more right-sided. He also complains of urinary frequency. No fever, sweats, chills. He has been taking Miralax for constipation. He is ambulatory without deficits  Past Medical History  Diagnosis Date  . Arthritis    History reviewed. No pertinent past surgical history. Family History  Problem Relation Age of Onset  . Family history unknown: Yes   Social History  Substance Use Topics  . Smoking status: Never Smoker   . Smokeless tobacco: Never Used  . Alcohol Use: No    Review of Systems  Gastrointestinal: Positive for constipation.  All other systems reviewed and are negative.     Allergies  Augmentin  Home Medications   Prior to Admission medications   Medication Sig Start Date End Date Taking? Authorizing Provider  aspirin 325 MG EC tablet Take 325 mg by mouth daily as needed for pain.   Yes Historical Provider, MD  mirabegron ER (MYRBETRIQ) 50 MG TB24 tablet Take 50 mg by mouth daily.   Yes Historical Provider, MD  polyethylene glycol (MIRALAX / GLYCOLAX) packet Take 17 g by mouth daily as needed. For constipation.   Yes Historical Provider, MD  sulfamethoxazole-trimethoprim (BACTRIM DS,SEPTRA DS) 800-160 MG tablet Take 1 tablet by mouth 2 (two) times daily. 12/16/15 12/23/15  Donnetta Hutching, MD  triamcinolone cream (KENALOG) 0.1 % Apply 1 application topically 2 (two) times daily. 12/16/15   Donnetta Hutching, MD   BP 121/104 mmHg  Pulse 96  Temp(Src) 97.6 F (36.4 C) (Oral)  Resp 18  Ht   (1.803 m)  Wt 187 lb (84.823 kg)  BMI 26.09 kg/m2  SpO2 97% Physical Exam  Constitutional: He is oriented to person, place, and time. He appears well-developed and well-nourished.  HENT:  Head: Normocephalic and atraumatic.  Eyes: Conjunctivae and EOM are normal. Pupils are equal, round, and reactive to light.  Neck: Normal range of motion. Neck supple.  Cardiovascular: Normal rate and regular rhythm.   Pulmonary/Chest: Effort normal and breath sounds normal.  Abdominal: Soft. Bowel sounds are normal.  Genitourinary:  Genitourinary exam: Penis appears normal. No testicular tenderness. Right side of scrotum appears boggy and tender.  Musculoskeletal: Normal range of motion.  Neurological: He is alert and oriented to person, place, and time.  Skin: Skin is warm and dry.  Psychiatric: He has a normal mood and affect. His behavior is normal.  Nursing note and vitals reviewed.   ED Course  Procedures (including critical care time) Labs Review Labs Reviewed  BASIC METABOLIC PANEL - Abnormal; Notable for the following:    Glucose, Bld 102 (*)    All other components within normal limits  CBC WITH DIFFERENTIAL/PLATELET  URINALYSIS, ROUTINE W REFLEX MICROSCOPIC (NOT AT [facility])    Imaging Review No results found. I have personally reviewed and evaluated these images and lab results as part of my medical decision-making.   EKG Interpretation None      MDM   Final diagnoses:  Dermatitis    History and physical most consistent with a scrotal dermatitis. It could be a cellulitis;  however his white count is normal. Will start Septra DS and Aristocort 0.1% ointment. Follow-up with urology. Patient is nontoxic-appearing    Donnetta Hutching, MD 12/16/15 [phone]

## 2015-12-16 NOTE — ED Notes (Signed)
Patient states that he has been constipated and taking OTC Miralax . Patiaent states he has been hving frequent small BM's but still feels constipated. Patient c/o frequent urination and scrotal swelling. Patient denies dysuria.

## 2015-12-16 NOTE — Discharge Instructions (Signed)
Prescription for antibiotic and ointment for your scrotum. Follow-up with urologist. Phone number given.

## 2016-01-15 ENCOUNTER — Encounter (HOSPITAL_COMMUNITY): Payer: Self-pay | Admitting: Emergency Medicine

## 2016-01-15 ENCOUNTER — Emergency Department (HOSPITAL_COMMUNITY)
Admission: EM | Admit: 2016-01-15 | Discharge: 2016-01-15 | Discharge status: Against Medical Advice | Payer: Medicare Other | Attending: Emergency Medicine | Admitting: Emergency Medicine

## 2016-01-15 DIAGNOSIS — Z7982 Long term (current) use of aspirin: Secondary | ICD-10-CM | POA: Diagnosis not present

## 2016-01-15 DIAGNOSIS — M1990 Unspecified osteoarthritis, unspecified site: Secondary | ICD-10-CM | POA: Insufficient documentation

## 2016-01-15 DIAGNOSIS — Z7952 Long term (current) use of systemic steroids: Secondary | ICD-10-CM | POA: Insufficient documentation

## 2016-01-15 DIAGNOSIS — Z79899 Other long term (current) drug therapy: Secondary | ICD-10-CM | POA: Insufficient documentation

## 2016-01-15 DIAGNOSIS — K5900 Constipation, unspecified: Secondary | ICD-10-CM | POA: Insufficient documentation

## 2016-01-15 DIAGNOSIS — Z760 Encounter for issue of repeat prescription: Secondary | ICD-10-CM | POA: Insufficient documentation

## 2016-01-15 DIAGNOSIS — R300 Dysuria: Secondary | ICD-10-CM | POA: Insufficient documentation

## 2016-01-15 DIAGNOSIS — R350 Frequency of micturition: Secondary | ICD-10-CM | POA: Insufficient documentation

## 2016-01-15 MED ORDER — ACETAMINOPHEN 325 MG PO TABS: 650.0000 mg | ORAL_TABLET | Freq: Once | ORAL | Status: DC

## 2016-01-15 NOTE — ED Notes (Signed)
Pt requests refill for Miralax for constipation. Pt needs prescription for insurance purposes. Pt states he does not need evaluation, only needs medication.

## 2016-01-15 NOTE — ED Provider Notes (Signed)
CSN: [id number]     Arrival date & time 01/15/16  0904 History   First MD Initiated Contact with Patient 01/15/16 1006     No chief complaint on file.    HPI   Thomas Rich is an 80 y.o. male with history of arthritis who presents to the ED for med refill. He reports he has chronic issues with constipation/hard BM and frequently requires Miralax for relief of his symptoms. He states he does not currently have a PCP and so he comes to the ED/[facility] for miralax prescriptions which he states works well for him. Denies abdominal pain, n/v/d, black tarry stools, BRBPR.  Pt also complaining of dysuria and increased urinary frequency for the past two months. He states he used to be on medication for "urinary problems" but does not remember the name. He denies scrotal edema, penile discharge, fever, chills. He declines GU exam today.   Past Medical History  Diagnosis Date  . Arthritis    History reviewed. No pertinent past surgical history. Family History  Problem Relation Age of Onset  . Family history unknown: Yes   Social History  Substance Use Topics  . Smoking status: Never Smoker   . Smokeless tobacco: Never Used  . Alcohol Use: No    Review of Systems  All other systems reviewed and are negative.     Allergies  Augmentin  Home Medications   Prior to Admission medications   Medication Sig Start Date End Date Taking? Authorizing Provider  aspirin 325 MG EC tablet Take 325 mg by mouth daily as needed for pain.    Historical Provider, MD  mirabegron ER (MYRBETRIQ) 50 MG TB24 tablet Take 50 mg by mouth daily.    Historical Provider, MD  polyethylene glycol (MIRALAX / GLYCOLAX) packet Take 17 g by mouth daily as needed. For constipation.    Historical Provider, MD  triamcinolone cream (KENALOG) 0.1 % Apply 1 application topically 2 (two) times daily. 12/16/15   Donnetta Hutching, MD   BP 153/103 mmHg  Pulse 91  Temp(Src) 97.7 F (36.5 C) (Oral)  Resp 15  SpO2 99% Physical  Exam  Constitutional: He is oriented to person, place, and time. No distress.  HENT:  Head: Atraumatic.  Right Ear: External ear normal.  Left Ear: External ear normal.  Nose: Nose normal.  Eyes: Conjunctivae are normal. No scleral icterus.  Neck: Normal range of motion. Neck supple.  Cardiovascular: Normal rate and regular rhythm.   Pulmonary/Chest: Effort normal. No respiratory distress. He exhibits no tenderness.  Abdominal: Soft. Bowel sounds are normal. He exhibits no distension. There is no tenderness. There is no rebound and no guarding.  Neurological: He is alert and oriented to person, place, and time.  Skin: Skin is warm and dry. He is not diaphoretic.  Psychiatric: He has a normal mood and affect. His behavior is normal.  Nursing note and vitals reviewed.   ED Course  Procedures (including critical care time) Labs Review Labs Reviewed  URINALYSIS, ROUTINE W REFLEX MICROSCOPIC (NOT AT [facility])    Imaging Review No results found. I have personally reviewed and evaluated these images and lab results as part of my medical decision-making.   EKG Interpretation None      MDM   Final diagnoses:  None    Pt left AMA. He came out of his room and stated his arthritis was hurting him in his knees and shoulders and needed to go home to take his medication. I assisted him back  to his room and asked him to stay to complete evaluation and I can give him medication for OA here. He initially agreed. Few minutes later was witnessed walking out of the ED by multiple staff. Reported to RN that he needs to leave and he can take his medication at home.     Carlene Coria, PA-C 01/15/16 1111  Mancel Bale, MD 01/15/16 [phone]

## 2016-01-15 NOTE — ED Notes (Signed)
Patient states that he is leaving. Reports that he can take his medication at home. Walked out [employer].

## 2016-02-09 ENCOUNTER — Encounter (HOSPITAL_COMMUNITY): Payer: Self-pay | Admitting: Emergency Medicine

## 2016-02-09 ENCOUNTER — Emergency Department (HOSPITAL_COMMUNITY)
Admission: EM | Admit: 2016-02-09 | Discharge: 2016-02-09 | Disposition: A | Discharge status: Home / Self Care | Payer: Medicare Other | Attending: Emergency Medicine | Admitting: Emergency Medicine

## 2016-02-09 DIAGNOSIS — R350 Frequency of micturition: Secondary | ICD-10-CM | POA: Diagnosis not present

## 2016-02-09 DIAGNOSIS — Z7982 Long term (current) use of aspirin: Secondary | ICD-10-CM | POA: Insufficient documentation

## 2016-02-09 DIAGNOSIS — M545 Low back pain, unspecified: Secondary | ICD-10-CM

## 2016-02-09 DIAGNOSIS — G8929 Other chronic pain: Secondary | ICD-10-CM | POA: Insufficient documentation

## 2016-02-09 DIAGNOSIS — Z79899 Other long term (current) drug therapy: Secondary | ICD-10-CM | POA: Diagnosis not present

## 2016-02-09 DIAGNOSIS — M1990 Unspecified osteoarthritis, unspecified site: Secondary | ICD-10-CM | POA: Insufficient documentation

## 2016-02-09 DIAGNOSIS — L988 Other specified disorders of the skin and subcutaneous tissue: Secondary | ICD-10-CM | POA: Diagnosis not present

## 2016-02-09 DIAGNOSIS — R1030 Lower abdominal pain, unspecified: Secondary | ICD-10-CM | POA: Insufficient documentation

## 2016-02-09 DIAGNOSIS — I10 Essential (primary) hypertension: Secondary | ICD-10-CM | POA: Diagnosis not present

## 2016-02-09 DIAGNOSIS — M549 Dorsalgia, unspecified: Secondary | ICD-10-CM | POA: Diagnosis present

## 2016-02-09 LAB — CBC
HEMATOCRIT: 49.8 % (ref 39.0–52.0)
HEMOGLOBIN: 16.6 g/dL (ref 13.0–17.0)
MCH: 30.7 pg (ref 26.0–34.0)
MCHC: 33.3 g/dL (ref 30.0–36.0)
MCV: 92.1 fL (ref 78.0–100.0)
Platelets: 213 10*3/uL (ref 150–400)
RBC: 5.41 MIL/uL (ref 4.22–5.81)
RDW: 13.5 % (ref 11.5–15.5)
WBC: 4.7 10*3/uL (ref 4.0–10.5)

## 2016-02-09 LAB — URINALYSIS, ROUTINE W REFLEX MICROSCOPIC
GLUCOSE, UA: NEGATIVE mg/dL
HGB URINE DIPSTICK: NEGATIVE
Ketones, ur: NEGATIVE mg/dL
LEUKOCYTES UA: NEGATIVE
Nitrite: NEGATIVE
PH: 7 (ref 5.0–8.0)
Protein, ur: NEGATIVE mg/dL
Specific Gravity, Urine: 1.023 (ref 1.005–1.030)

## 2016-02-09 LAB — BASIC METABOLIC PANEL
Anion gap: 10 (ref 5–15)
BUN: 11 mg/dL (ref 6–20)
CHLORIDE: 105 mmol/L (ref 101–111)
CO2: 26 mmol/L (ref 22–32)
CREATININE: 1.18 mg/dL (ref 0.61–1.24)
Calcium: 9.2 mg/dL (ref 8.9–10.3)
GFR calc non Af Amer: 58 mL/min — ABNORMAL LOW (ref >60)
GLUCOSE: 104 mg/dL — AB (ref 65–99)
Potassium: 3.9 mmol/L (ref 3.5–5.1)
Sodium: 141 mmol/L (ref 135–145)

## 2016-02-09 NOTE — ED Notes (Signed)
PT kept moving while taking vitals. Nurse is aware of PT BP

## 2016-02-09 NOTE — ED Notes (Signed)
Discharge instructions and follow up care reviewed with patient. Patient verbalized understanding. 

## 2016-02-09 NOTE — Discharge Instructions (Signed)
Back Pain, Adult  Back pain is very common in adults.The cause of back pain is rarely dangerous and the pain often gets better over time.The cause of your back pain may not be known. Some common causes of back pain include:   Strain of the muscles or ligaments supporting the spine.   Wear and tear (degeneration) of the spinal disks.   Arthritis.   Direct injury to the back.  For many people, back pain may return. Since back pain is rarely dangerous, most people can learn to manage this condition on their own.  HOME CARE INSTRUCTIONS  Watch your back pain for any changes. The following actions may help to lessen any discomfort you are feeling:   Remain active. It is stressful on your back to sit or stand in one place for long periods of time. Do not sit, drive, or stand in one place for more than 30 minutes at a time. Take short walks on even surfaces as soon as you are able.Try to increase the length of time you walk each day.   Exercise regularly as directed by your health care provider. Exercise helps your back heal faster. It also helps avoid future injury by keeping your muscles strong and flexible.   Do not stay in bed.Resting more than 1-2 days can delay your recovery.   Pay attention to your body when you bend and lift. The most comfortable positions are those that put less stress on your recovering back. Always use proper lifting techniques, including:    Bending your knees.    Keeping the load close to your body.    Avoiding twisting.   Find a comfortable position to sleep. Use a firm mattress and lie on your side with your knees slightly bent. If you lie on your back, put a pillow under your knees.   Avoid feeling anxious or stressed.Stress increases muscle tension and can worsen back pain.It is important to recognize when you are anxious or stressed and learn ways to manage it, such as with exercise.   Take medicines only as directed by your health care provider. Over-the-counter  medicines to reduce pain and inflammation are often the most helpful.Your health care provider may prescribe muscle relaxant drugs.These medicines help dull your pain so you can more quickly return to your normal activities and healthy exercise.   Apply ice to the injured area:    Put ice in a plastic bag.    Place a towel between your skin and the bag.    Leave the ice on for 20 minutes, 2-3 times a day for the first 2-3 days. After that, ice and heat may be alternated to reduce pain and spasms.   Maintain a healthy weight. Excess weight puts extra stress on your back and makes it difficult to maintain good posture.  SEEK MEDICAL CARE IF:   You have pain that is not relieved with rest or medicine.   You have increasing pain going down into the legs or buttocks.   You have pain that does not improve in one week.   You have night pain.   You lose weight.   You have a fever or chills.  SEEK IMMEDIATE MEDICAL CARE IF:    You develop new bowel or bladder control problems.   You have unusual weakness or numbness in your arms or legs.   You develop nausea or vomiting.   You develop abdominal pain.   You feel faint.     This information   is not intended to replace advice given to you by your health care provider. Make sure you discuss any questions you have with your health care provider.     Document Released: 11/08/2005 Document Revised: 11/29/2014 Document Reviewed: 03/12/2014  Elsevier Interactive Patient Education 2016 [employer]  Hypertension  Hypertension, commonly called high blood pressure, is when the force of blood pumping through your arteries is too strong. Your arteries are the blood vessels that carry blood from your heart throughout your body. A blood pressure reading consists of a higher number over a lower number, such as 110/72. The higher number (systolic) is the pressure inside your arteries when your heart pumps. The lower number (diastolic) is the pressure inside your arteries when  your heart relaxes. Ideally you want your blood pressure below 120/80.  Hypertension forces your heart to work harder to pump blood. Your arteries may become narrow or stiff. Having untreated or uncontrolled hypertension can cause heart attack, stroke, kidney disease, and other problems.  RISK FACTORS  Some risk factors for high blood pressure are controllable. Others are not.   Risk factors you cannot control include:    Race. You may be at higher risk if you are African American.   Age. Risk increases with age.   Gender. Men are at higher risk than women before age 45 years. After age 65, women are at higher risk than men.  Risk factors you can control include:   Not getting enough exercise or physical activity.   Being overweight.   Getting too much fat, sugar, calories, or salt in your diet.   Drinking too much alcohol.  SIGNS AND SYMPTOMS  Hypertension does not usually cause signs or symptoms. Extremely high blood pressure (hypertensive crisis) may cause headache, anxiety, shortness of breath, and nosebleed.  DIAGNOSIS  To check if you have hypertension, your health care provider will measure your blood pressure while you are seated, with your arm held at the level of your heart. It should be measured at least twice using the same arm. Certain conditions can cause a difference in blood pressure between your right and left arms. A blood pressure reading that is higher than normal on one occasion does not mean that you need treatment. If it is not clear whether you have high blood pressure, you may be asked to return on a different day to have your blood pressure checked again. Or, you may be asked to monitor your blood pressure at home for 1 or more weeks.  TREATMENT  Treating high blood pressure includes making lifestyle changes and possibly taking medicine. Living a healthy lifestyle can help lower high blood pressure. You may need to change some of your habits.  Lifestyle changes may  include:   Following the DASH diet. This diet is high in fruits, vegetables, and whole grains. It is low in salt, red meat, and added sugars.   Keep your sodium intake below 2,300 mg per day.   Getting at least 30-45 minutes of aerobic exercise at least 4 times per week.   Losing weight if necessary.   Not smoking.   Limiting alcoholic beverages.   Learning ways to reduce stress.  Your health care provider may prescribe medicine if lifestyle changes are not enough to get your blood pressure under control, and if one of the following is true:   You are 18-59 years of age and your systolic blood pressure is above 140.   You are 60 years of age or older,   and your systolic blood pressure is above 150.   Your diastolic blood pressure is above 90.   You have diabetes, and your systolic blood pressure is over 140 or your diastolic blood pressure is over 90.   You have kidney disease and your blood pressure is above 140/90.   You have heart disease and your blood pressure is above 140/90.  Your personal target blood pressure may vary depending on your medical conditions, your age, and other factors.  HOME CARE INSTRUCTIONS   Have your blood pressure rechecked as directed by your health care provider.    Take medicines only as directed by your health care provider. Follow the directions carefully. Blood pressure medicines must be taken as prescribed. The medicine does not work as well when you skip doses. Skipping doses also puts you at risk for problems.   Do not smoke.    Monitor your blood pressure at home as directed by your health care provider.  SEEK MEDICAL CARE IF:    You think you are having a reaction to medicines taken.   You have recurrent headaches or feel dizzy.   You have swelling in your ankles.   You have trouble with your vision.  SEEK IMMEDIATE MEDICAL CARE IF:   You develop a severe headache or confusion.   You have unusual weakness, numbness, or feel faint.   You have severe  chest or abdominal pain.   You vomit repeatedly.   You have trouble breathing.  MAKE SURE YOU:    Understand these instructions.   Will watch your condition.   Will get help right away if you are not doing well or get worse.     This information is not intended to replace advice given to you by your health care provider. Make sure you discuss any questions you have with your health care provider.     Document Released: 11/08/2005 Document Revised: 03/25/2015 Document Reviewed: 08/31/2013  Elsevier Interactive Patient Education 2016 Elsevier Inc.

## 2016-02-09 NOTE — ED Provider Notes (Signed)
CSN: [id number]     Arrival date & time 02/09/16  0913 History   First MD Initiated Contact with Patient 02/09/16 1056     Chief Complaint  Patient presents with  . Facial Pain  . Urinary Frequency     Patient is a 80 y.o. male presenting with frequency. The history is provided by the patient.  Urinary Frequency This is a new problem. Pertinent negatives include no chest pain, no abdominal pain and no shortness of breath.  Patient presents with burning with urination and back pain. States been going on for a while now. No fevers. No fevers or chills. No nausea vomiting diarrhea. States he also has had this in the past. States he got seen in the hospital for it. The pain is dull and constant. No nausea vomiting or diarrhea. Does have chronic back pain.  He also is complaining of irritation of his face. States that when he shaves he bleeds. No other bleeding areas. States this is been going on for a long time also.  Past Medical History  Diagnosis Date  . Arthritis    History reviewed. No pertinent past surgical history. Family History  Problem Relation Age of Onset  . Family history unknown: Yes   Social History  Substance Use Topics  . Smoking status: Never Smoker   . Smokeless tobacco: Never Used  . Alcohol Use: No    Review of Systems  Constitutional: Negative for fever and appetite change.  Respiratory: Negative for shortness of breath.   Cardiovascular: Negative for chest pain.  Gastrointestinal: Negative for abdominal pain.  Genitourinary: Positive for frequency. Negative for discharge and penile pain.  Musculoskeletal: Positive for back pain.  Skin: Positive for wound.  Neurological: Negative for facial asymmetry.      Allergies  Augmentin  Home Medications   Prior to Admission medications   Medication Sig Start Date End Date Taking? Authorizing Provider  aspirin 325 MG EC tablet Take 325 mg by mouth daily as needed for pain.   Yes Historical Provider, MD   mirabegron ER (MYRBETRIQ) 50 MG TB24 tablet Take 50 mg by mouth daily.   Yes Historical Provider, MD  polyethylene glycol (MIRALAX / GLYCOLAX) packet Take 17 g by mouth daily as needed. For constipation.   Yes Historical Provider, MD  triamcinolone cream (KENALOG) 0.1 % Apply 1 application topically 2 (two) times daily. Patient taking differently: Apply 1 application topically 2 (two) times daily as needed (rash/ irritation).  12/16/15  Yes Donnetta HutchingBrian Cook, MD   BP 136/110 mmHg  Pulse 89  Temp(Src) 98.2 F (36.8 C) (Oral)  Resp 18  SpO2 100% Physical Exam  Constitutional: He appears well-developed.  HENT:  Head: Atraumatic.  Neck: Neck supple.  Cardiovascular: Normal rate.   Pulmonary/Chest: No respiratory distress.  Abdominal: Soft. There is tenderness.  Mild suprapubic tenderness without rebound or guarding.  Neurological: He is alert.  Skin: No erythema. No pallor.  Skin has peeling plaques over his face. Skin underneath appears overall healthy. Few areas of scraping./Bleeding.  Psychiatric: He has a normal mood and affect.    ED Course  Procedures (including critical care time) Labs Review Labs Reviewed  URINALYSIS, ROUTINE W REFLEX MICROSCOPIC (NOT AT [facility]) - Abnormal; Notable for the following:    Color, Urine AMBER (*)    Bilirubin Urine SMALL (*)    All other components within normal limits  BASIC METABOLIC PANEL - Abnormal; Notable for the following:    Glucose, Bld 104 (*)  GFR calc non Af Amer 58 (*)    All other components within normal limits  CBC    Imaging Review No results found. I have personally reviewed and evaluated these images and lab results as part of my medical decision-making.   EKG Interpretation None      MDM   Final diagnoses:  Midline low back pain without sciatica  Essential hypertension    Patient with back pain and hypertension. Does not her primary care doctor. Has dysuria but urinalysis does not show infection. Also has some  dry skin on his face. Has been seen by case management to has helped to arrange follow-up. Will discharge home. Will need follow-up for his blood pressure.   Benjiman Core, MD 02/09/16 [phone]

## 2016-02-09 NOTE — Progress Notes (Signed)
CM consulted by EDP, [place] for pcp assistance for pt Cm spoke with pt about medicare and medicaid pcp availability  Pt states [facility] under Dr Katy Apotellin but prefers to see another provider CM discussed [facility] -[facility]onsu, [place] medicare providers within pt zip code [phone] and also on medicaid list Pt prefers to call office for an appointment  Pt informed CM he has signed forms and was ready for d/c but EDP states he had not and is awaiting ED RN Pt pleasant and cooperatively returned to his room

## 2016-02-09 NOTE — ED Notes (Signed)
Pt c/o of urinary frequency and burning when he urinates x 1 week. Pt c/o lower back pain. Pt sts he has been seen for this before. Pt also sts that he would like to be able to shave his beard but that his face bleeds when he shaves. Pt would like help with this. A&Ox4 and ambulatory.

## 2016-02-09 NOTE — ED Notes (Signed)
MD at bedside. 

## 2016-03-09 DIAGNOSIS — Z136 Encounter for screening for cardiovascular disorders: Secondary | ICD-10-CM | POA: Diagnosis not present

## 2016-03-09 DIAGNOSIS — M25561 Pain in right knee: Secondary | ICD-10-CM | POA: Diagnosis not present

## 2016-03-09 DIAGNOSIS — M545 Low back pain: Secondary | ICD-10-CM | POA: Diagnosis not present

## 2016-03-09 DIAGNOSIS — Z131 Encounter for screening for diabetes mellitus: Secondary | ICD-10-CM | POA: Diagnosis not present

## 2016-03-09 DIAGNOSIS — R7303 Prediabetes: Secondary | ICD-10-CM | POA: Diagnosis not present

## 2016-03-09 DIAGNOSIS — H538 Other visual disturbances: Secondary | ICD-10-CM | POA: Diagnosis not present

## 2016-03-09 DIAGNOSIS — J029 Acute pharyngitis, unspecified: Secondary | ICD-10-CM | POA: Diagnosis not present

## 2016-03-09 DIAGNOSIS — K529 Noninfective gastroenteritis and colitis, unspecified: Secondary | ICD-10-CM | POA: Diagnosis not present

## 2016-03-09 DIAGNOSIS — Z01118 Encounter for examination of ears and hearing with other abnormal findings: Secondary | ICD-10-CM | POA: Diagnosis not present

## 2016-03-09 DIAGNOSIS — F418 Other specified anxiety disorders: Secondary | ICD-10-CM | POA: Diagnosis not present

## 2016-03-09 DIAGNOSIS — Z0000 Encounter for general adult medical examination without abnormal findings: Secondary | ICD-10-CM | POA: Diagnosis not present

## 2016-03-24 DIAGNOSIS — R739 Hyperglycemia, unspecified: Secondary | ICD-10-CM | POA: Diagnosis not present

## 2016-03-24 DIAGNOSIS — M545 Low back pain: Secondary | ICD-10-CM | POA: Diagnosis not present

## 2016-03-24 DIAGNOSIS — M25561 Pain in right knee: Secondary | ICD-10-CM | POA: Diagnosis not present

## 2016-03-24 DIAGNOSIS — E039 Hypothyroidism, unspecified: Secondary | ICD-10-CM | POA: Diagnosis not present

## 2016-03-24 DIAGNOSIS — E538 Deficiency of other specified B group vitamins: Secondary | ICD-10-CM | POA: Diagnosis not present

## 2016-03-24 DIAGNOSIS — T148 Other injury of unspecified body region: Secondary | ICD-10-CM | POA: Diagnosis not present

## 2016-03-24 DIAGNOSIS — R7303 Prediabetes: Secondary | ICD-10-CM | POA: Diagnosis not present

## 2016-03-24 DIAGNOSIS — K529 Noninfective gastroenteritis and colitis, unspecified: Secondary | ICD-10-CM | POA: Diagnosis not present

## 2016-03-25 DIAGNOSIS — L309 Dermatitis, unspecified: Secondary | ICD-10-CM | POA: Diagnosis not present

## 2016-03-25 DIAGNOSIS — L731 Pseudofolliculitis barbae: Secondary | ICD-10-CM | POA: Diagnosis not present

## 2016-11-15 ENCOUNTER — Emergency Department (HOSPITAL_COMMUNITY)
Admission: EM | Admit: 2016-11-15 | Discharge: 2016-11-15 | Disposition: A | Discharge status: Home / Self Care | Payer: Medicare Other | Attending: Emergency Medicine | Admitting: Emergency Medicine

## 2016-11-15 ENCOUNTER — Encounter (HOSPITAL_COMMUNITY): Payer: Self-pay | Admitting: Emergency Medicine

## 2016-11-15 DIAGNOSIS — Z7982 Long term (current) use of aspirin: Secondary | ICD-10-CM | POA: Diagnosis not present

## 2016-11-15 DIAGNOSIS — R300 Dysuria: Secondary | ICD-10-CM | POA: Diagnosis present

## 2016-11-15 DIAGNOSIS — N342 Other urethritis: Secondary | ICD-10-CM

## 2016-11-15 LAB — URINALYSIS, ROUTINE W REFLEX MICROSCOPIC
BILIRUBIN URINE: NEGATIVE
Glucose, UA: NEGATIVE mg/dL
Hgb urine dipstick: NEGATIVE
KETONES UR: NEGATIVE mg/dL
Leukocytes, UA: NEGATIVE
NITRITE: NEGATIVE
Protein, ur: NEGATIVE mg/dL
SPECIFIC GRAVITY, URINE: 1.016 (ref 1.005–1.030)
pH: 5 (ref 5.0–8.0)

## 2016-11-15 LAB — CBC WITH DIFFERENTIAL/PLATELET
BASOS ABS: 0 10*3/uL (ref 0.0–0.1)
Basophils Relative: 1 %
EOS ABS: 0.4 10*3/uL (ref 0.0–0.7)
EOS PCT: 8 %
HEMATOCRIT: 46.3 % (ref 39.0–52.0)
Hemoglobin: 15.7 g/dL (ref 13.0–17.0)
Lymphocytes Relative: 38 %
Lymphs Abs: 1.8 10*3/uL (ref 0.7–4.0)
MCH: 30.4 pg (ref 26.0–34.0)
MCHC: 33.9 g/dL (ref 30.0–36.0)
MCV: 89.7 fL (ref 78.0–100.0)
Monocytes Absolute: 0.5 10*3/uL (ref 0.1–1.0)
Monocytes Relative: 11 %
Neutro Abs: 2.1 10*3/uL (ref 1.7–7.7)
Neutrophils Relative %: 42 %
Platelets: 235 10*3/uL (ref 150–400)
RBC: 5.16 MIL/uL (ref 4.22–5.81)
RDW: 13 % (ref 11.5–15.5)
WBC: 4.8 10*3/uL (ref 4.0–10.5)

## 2016-11-15 LAB — BASIC METABOLIC PANEL
Anion gap: 7 (ref 5–15)
BUN: 16 mg/dL (ref 6–20)
CALCIUM: 9.4 mg/dL (ref 8.9–10.3)
CO2: 27 mmol/L (ref 22–32)
CREATININE: 1.2 mg/dL (ref 0.61–1.24)
Chloride: 105 mmol/L (ref 101–111)
GFR calc non Af Amer: 56 mL/min — ABNORMAL LOW (ref >60)
Glucose, Bld: 117 mg/dL — ABNORMAL HIGH (ref 65–99)
Potassium: 3.6 mmol/L (ref 3.5–5.1)
SODIUM: 139 mmol/L (ref 135–145)

## 2016-11-15 LAB — LACTIC ACID, PLASMA: LACTIC ACID, VENOUS: 1.9 mmol/L (ref 0.5–1.9)

## 2016-11-15 MED ORDER — LIDOCAINE HCL 1 % IJ SOLN
INTRAMUSCULAR | Status: AC
Start: 2016-11-15 — End: 2016-11-15
  Administered 2016-11-15: 20 mL
  Filled 2016-11-15: qty 20

## 2016-11-15 MED ORDER — DOXYCYCLINE HYCLATE 100 MG PO TABS: 100.0000 mg | ORAL_TABLET | Freq: Two times a day (BID) | ORAL | 0 refills | Status: DC

## 2016-11-15 MED ORDER — CEFTRIAXONE SODIUM 250 MG IJ SOLR
250.0000 mg | Freq: Once | INTRAMUSCULAR | Status: AC
  Administered 2016-11-15: 250 mg via INTRAMUSCULAR
  Filled 2016-11-15: qty 250

## 2016-11-15 MED ORDER — DOXYCYCLINE HYCLATE 100 MG PO TABS
100.0000 mg | ORAL_TABLET | Freq: Once | ORAL | Status: AC
  Administered 2016-11-15: 100 mg via ORAL
  Filled 2016-11-15: qty 1

## 2016-11-15 NOTE — ED Provider Notes (Signed)
[facility]-EMERGENCY DEPT Provider Note   CSN: [id number] Arrival date & time: 11/15/16  1053     History   Chief Complaint Chief Complaint  Patient presents with  . Dysuria    HPI Thomas Rich is a 80 y.o. male.  HPI  Pt was seen at 1125. Per pt, c/o gradual onset and persistence of constant dysuria for the past 3 days.  Denies flank pain, no hematuria, no fevers, no abd pain, no N/V/D, no testicular pain/swelling, no rash.   Past Medical History:  Diagnosis Date  . Arthritis     Patient Active Problem List   Diagnosis Date Noted  . BPH (benign prostatic hyperplasia) 04/26/2012  . Dizziness - light-headed 04/26/2012  . Sinusitis 04/26/2012  . UTI (lower urinary tract infection) 04/26/2012  . Hypotension 04/26/2012  . Back pain 04/26/2012  . Acute kidney injury (HCC) 04/26/2012  . Hypokalemia 04/26/2012    History reviewed. No pertinent surgical history.     Home Medications    Prior to Admission medications   Medication Sig Start Date End Date Taking? Authorizing Provider  aspirin 325 MG EC tablet Take 325 mg by mouth daily as needed for pain.    Historical Provider, MD  mirabegron ER (MYRBETRIQ) 50 MG TB24 tablet Take 50 mg by mouth daily.    Historical Provider, MD  polyethylene glycol (MIRALAX / GLYCOLAX) packet Take 17 g by mouth daily as needed. For constipation.    Historical Provider, MD  triamcinolone cream (KENALOG) 0.1 % Apply 1 application topically 2 (two) times daily. Patient taking differently: Apply 1 application topically 2 (two) times daily as needed (rash/ irritation).  12/16/15   Donnetta HutchingBrian Cook, MD    Family History Family History  Problem Relation Age of Onset  . Family history unknown: Yes    Social History Social History  Substance Use Topics  . Smoking status: Never Smoker  . Smokeless tobacco: Never Used  . Alcohol use No     Allergies   Augmentin [amoxicillin-pot clavulanate]   Review of Systems Review of  Systems ROS: Statement: All systems negative except as marked or noted in the HPI; Constitutional: Negative for fever and chills. ; ; Eyes: Negative for eye pain, redness and discharge. ; ; ENMT: Negative for ear pain, hoarseness, nasal congestion, sinus pressure and sore throat. ; ; Cardiovascular: Negative for chest pain, palpitations, diaphoresis, dyspnea and peripheral edema. ; ; Respiratory: Negative for cough, wheezing and stridor. ; ; Gastrointestinal: Negative for nausea, vomiting, diarrhea, abdominal pain, blood in stool, hematemesis, jaundice and rectal bleeding. . ; ; Genitourinary: +dysuria. Negative for flank pain and hematuria. ; ; Genital:  No penile drainage or rash, no testicular pain or swelling, no scrotal rash or swelling. ;; Musculoskeletal: Negative for back pain and neck pain. Negative for swelling and trauma.; ; Skin: Negative for pruritus, rash, abrasions, blisters, bruising and skin lesion.; ; Neuro: Negative for headache, lightheadedness and neck stiffness. Negative for weakness, altered level of consciousness, altered mental status, extremity weakness, paresthesias, involuntary movement, seizure and syncope.       Physical Exam Updated Vital Signs BP 145/86   Pulse 92   Temp 97.7 F (36.5 C) (Oral)   Resp 16   SpO2 97%   Physical Exam 1130: Physical examination:  Nursing notes reviewed; Vital signs and O2 SAT reviewed;  Constitutional: Well developed, Well nourished, Well hydrated, In no acute distress; Head:  Normocephalic, atraumatic; Eyes: EOMI, PERRL, No scleral icterus; ENMT: Mouth and pharynx normal, Mucous membranes  moist; Neck: Supple, Full range of motion, No lymphadenopathy; Cardiovascular: Regular rate and rhythm, No gallop; Respiratory: Breath sounds clear & equal bilaterally, No wheezes.  Speaking full sentences with ease, Normal respiratory effort/excursion; Chest: Nontender, Movement normal; Abdomen: Soft, Nontender, Nondistended, Normal bowel sounds;  Genitourinary: No CVA tenderness; Extremities: Pulses normal, No tenderness, No edema, No calf edema or asymmetry.; Neuro: AA&Ox3, Major CN grossly intact.  Speech clear. No gross focal motor or sensory deficits in extremities. Climbs on and off chair in exam room easily by himself. Gait steady.; Skin: Color normal, Warm, Dry.   ED Treatments / Results  Labs (all labs ordered are listed, but only abnormal results are displayed)   EKG  EKG Interpretation None       Radiology   Procedures Procedures (including critical care time)  Medications Ordered in ED Medications - No data to display   Initial Impression / Assessment and Plan / ED Course  I have reviewed the triage vital signs and the nursing notes.  Pertinent labs & imaging results that were available during my care of the patient were reviewed by me and considered in my medical decision making (see chart for details).  MDM Reviewed: previous chart, nursing note and vitals Reviewed previous: labs Interpretation: labs   Results for orders placed or performed during the hospital encounter of 11/15/16  Urinalysis, Routine w reflex microscopic- may I&O cath if menses  Result Value Ref Range   Color, Urine YELLOW YELLOW   APPearance CLEAR CLEAR   Specific Gravity, Urine 1.016 1.005 - 1.030   pH 5.0 5.0 - 8.0   Glucose, UA NEGATIVE NEGATIVE mg/dL   Hgb urine dipstick NEGATIVE NEGATIVE   Bilirubin Urine NEGATIVE NEGATIVE   Ketones, ur NEGATIVE NEGATIVE mg/dL   Protein, ur NEGATIVE NEGATIVE mg/dL   Nitrite NEGATIVE NEGATIVE   Leukocytes, UA NEGATIVE NEGATIVE  Basic metabolic panel  Result Value Ref Range   Sodium 139 135 - 145 mmol/L   Potassium 3.6 3.5 - 5.1 mmol/L   Chloride 105 101 - 111 mmol/L   CO2 27 22 - 32 mmol/L   Glucose, Bld 117 (H) 65 - 99 mg/dL   BUN 16 6 - 20 mg/dL   Creatinine, Ser 1.611.20 0.61 - 1.24 mg/dL   Calcium 9.4 8.9 - 09.610.3 mg/dL   GFR calc non Af Amer 56 (L) >60 mL/min   GFR calc Af Amer  >60 >60 mL/min   Anion gap 7 5 - 15  Lactic acid, plasma  Result Value Ref Range   Lactic Acid, Venous 1.9 0.5 - 1.9 mmol/L  CBC with Differential  Result Value Ref Range   WBC 4.8 4.0 - 10.5 K/uL   RBC 5.16 4.22 - 5.81 MIL/uL   Hemoglobin 15.7 13.0 - 17.0 g/dL   HCT 04.546.3 40.939.0 - 81.152.0 %   MCV 89.7 78.0 - 100.0 fL   MCH 30.4 26.0 - 34.0 pg   MCHC 33.9 30.0 - 36.0 g/dL   RDW 91.413.0 78.211.5 - 95.615.5 %   Platelets 235 150 - 400 K/uL   Neutrophils Relative % 42 %   Neutro Abs 2.1 1.7 - 7.7 K/uL   Lymphocytes Relative 38 %   Lymphs Abs 1.8 0.7 - 4.0 K/uL   Monocytes Relative 11 %   Monocytes Absolute 0.5 0.1 - 1.0 K/uL   Eosinophils Relative 8 %   Eosinophils Absolute 0.4 0.0 - 0.7 K/uL   Basophils Relative 1 %   Basophils Absolute 0.0 0.0 - 0.1 K/uL  1245:  Udip without infection; UC pending. Will tx for urethritis at this time. Dx and testing d/w pt.  Questions answered.  Verb understanding, agreeable to d/c home with outpt f/u.    Final Clinical Impressions(s) / ED Diagnoses   Final diagnoses:  None    New Prescriptions New Prescriptions   No medications on file     Samuel Jester, DO 11/19/16 1815

## 2016-11-15 NOTE — ED Triage Notes (Signed)
Pt reports burning when he urinates and frequent urination.

## 2016-11-15 NOTE — Discharge Instructions (Signed)
Take the prescription as directed.  Call your regular Urologist tomorrow to schedule a follow up appointment within the next 3 days.  Return to the Emergency Department immediately sooner if worsening.

## 2016-11-16 LAB — URINE CULTURE: Culture: NO GROWTH

## 2017-08-22 ENCOUNTER — Emergency Department (HOSPITAL_COMMUNITY)
Admission: EM | Admit: 2017-08-22 | Discharge: 2017-08-22 | Discharge status: Against Medical Advice | Payer: Medicare Other | Attending: Emergency Medicine | Admitting: Emergency Medicine

## 2017-08-22 ENCOUNTER — Encounter (HOSPITAL_COMMUNITY): Payer: Self-pay | Admitting: *Deleted

## 2017-08-22 DIAGNOSIS — M25562 Pain in left knee: Secondary | ICD-10-CM | POA: Insufficient documentation

## 2017-08-22 DIAGNOSIS — Z5321 Procedure and treatment not carried out due to patient leaving prior to being seen by health care provider: Secondary | ICD-10-CM | POA: Diagnosis not present

## 2017-08-22 LAB — URINALYSIS, ROUTINE W REFLEX MICROSCOPIC
Bilirubin Urine: NEGATIVE
Glucose, UA: NEGATIVE mg/dL
HGB URINE DIPSTICK: NEGATIVE
Ketones, ur: NEGATIVE mg/dL
LEUKOCYTES UA: NEGATIVE
NITRITE: NEGATIVE
PROTEIN: NEGATIVE mg/dL
SPECIFIC GRAVITY, URINE: 1.012 (ref 1.005–1.030)
pH: 5 (ref 5.0–8.0)

## 2017-08-22 NOTE — ED Notes (Signed)
No answer when called x 3.  

## 2017-08-22 NOTE — ED Triage Notes (Signed)
Pt reports his arthritis in his left knee is acting up and he has had a few falls.  Pt also reports pain upon urination x 1 month+. Pt a/o x and ambulatory.

## 2017-08-23 ENCOUNTER — Emergency Department (HOSPITAL_COMMUNITY)
Admission: EM | Admit: 2017-08-23 | Discharge: 2017-08-23 | Disposition: A | Discharge status: Home / Self Care | Payer: Medicare Other | Attending: Emergency Medicine | Admitting: Emergency Medicine

## 2017-08-23 ENCOUNTER — Encounter (HOSPITAL_COMMUNITY): Payer: Self-pay | Admitting: Nurse Practitioner

## 2017-08-23 DIAGNOSIS — N401 Enlarged prostate with lower urinary tract symptoms: Secondary | ICD-10-CM | POA: Diagnosis not present

## 2017-08-23 DIAGNOSIS — Z79899 Other long term (current) drug therapy: Secondary | ICD-10-CM | POA: Diagnosis not present

## 2017-08-23 DIAGNOSIS — M1990 Unspecified osteoarthritis, unspecified site: Secondary | ICD-10-CM | POA: Diagnosis not present

## 2017-08-23 DIAGNOSIS — R350 Frequency of micturition: Secondary | ICD-10-CM | POA: Diagnosis present

## 2017-08-23 DIAGNOSIS — Z7982 Long term (current) use of aspirin: Secondary | ICD-10-CM | POA: Insufficient documentation

## 2017-08-23 MED ORDER — TAMSULOSIN HCL 0.4 MG PO CAPS: 0.4000 mg | ORAL_CAPSULE | Freq: Every day | ORAL | 0 refills | Status: DC

## 2017-08-23 NOTE — ED Triage Notes (Signed)
Patient states he was here yesterday because "of my urine symptoms". Reports having urinary urgency and pain when urinating. States his lower back hurts when he pees. Denies noticing any bleeding when urinating, just pain and frequency. States he has "arthritis in his joints for years".

## 2017-08-23 NOTE — ED Provider Notes (Signed)
[facility]-EMERGENCY DEPT Provider Note   CSN: [id number] Arrival date & time: 08/23/17  4098     History   Chief Complaint Chief Complaint  Patient presents with  . Dysuria  . Urinary Frequency  . Back Pain    HPI Thomas Rich is a 81 y.o. male.  HPI  81 year old male presents with 2 complaints. His first complaint is urinary frequency. This has been going on for "a while", that he estimates about a month. Occasionally he will have some pain with urination but it is not often. He states he is going small amounts frequently. There is no abdominal pain, penile pain, or testicular pain. He has some trouble starting his urination stream and goes multiple times at night. He has a history of chronic back pain but no worsening pain. No weakness in his extremities. He also complains of multiple areas of joint pain. This is been gone on for years. He states that his neck, back, and elbows and right knee hurt. He's not sure if they hurt worse in the morning. They are not worsening but are constantly a problem for him. He does not room what he has tried in the past for it. No fevers.  Past Medical History:  Diagnosis Date  . Arthritis     Patient Active Problem List   Diagnosis Date Noted  . BPH (benign prostatic hyperplasia) 04/26/2012  . Dizziness - light-headed 04/26/2012  . Sinusitis 04/26/2012  . UTI (lower urinary tract infection) 04/26/2012  . Hypotension 04/26/2012  . Back pain 04/26/2012  . Acute kidney injury (HCC) 04/26/2012  . Hypokalemia 04/26/2012    History reviewed. No pertinent surgical history.     Home Medications    Prior to Admission medications   Medication Sig Start Date End Date Taking? Authorizing Provider  aspirin 325 MG EC tablet Take 325 mg by mouth daily as needed for pain.    [provider]  doxycycline (VIBRA-TABS) 100 MG tablet Take 1 tablet (100 mg total) by mouth 2 (two) times daily. 11/15/16   Samuel Jester, DO  mirabegron  ER (MYRBETRIQ) 50 MG TB24 tablet Take 50 mg by mouth daily.    [provider]  polyethylene glycol (MIRALAX / GLYCOLAX) packet Take 17 g by mouth daily as needed. For constipation.    [provider]  tamsulosin (FLOMAX) 0.4 MG CAPS capsule Take 1 capsule (0.4 mg total) by mouth daily. 08/23/17   Pricilla Loveless, MD  triamcinolone cream (KENALOG) 0.1 % Apply 1 application topically 2 (two) times daily. Patient taking differently: Apply 1 application topically 2 (two) times daily as needed (rash/ irritation).  12/16/15   Donnetta Hutching, MD    Family History Family History  Problem Relation Age of Onset  . Family history unknown: Yes    Social History Social History  Substance Use Topics  . Smoking status: Never Smoker  . Smokeless tobacco: Never Used  . Alcohol use No     Allergies   Augmentin [amoxicillin-pot clavulanate]   Review of Systems Review of Systems  Constitutional: Negative for fever.  Genitourinary: Positive for dysuria and frequency. Negative for penile pain, penile swelling, scrotal swelling and testicular pain.  Musculoskeletal: Positive for arthralgias and back pain.  Neurological: Negative for weakness and numbness.  All other systems reviewed and are negative.    Physical Exam Updated Vital Signs BP 131/70   Pulse (!) 105   Temp 97.7 F (36.5 C)   Resp 18   Ht  (1.778 m)  Wt 83 kg (183 lb)   SpO2 99%   BMI 26.26 kg/m   Physical Exam  Constitutional: He is oriented to person, place, and time. He appears well-developed and well-nourished.  HENT:  Head: Normocephalic and atraumatic.  Right Ear: External ear normal.  Left Ear: External ear normal.  Nose: Nose normal.  Eyes: Right eye exhibits no discharge. Left eye exhibits no discharge.  Neck: Neck supple.  Cardiovascular: Normal rate, regular rhythm and normal heart sounds.   Pulmonary/Chest: Effort normal and breath sounds normal.  Abdominal: Soft. There is no  tenderness.  Genitourinary: Testes normal and penis normal. Prostate is enlarged. Prostate is not tender. Right testis shows no tenderness. Left testis shows no tenderness. Circumcised. No penile erythema or penile tenderness. No discharge found.  Musculoskeletal: He exhibits no edema.  No joint swelling or tenderness noted in his upper or lower extremities. No erythema. Mild cervical and lumbar tenderness.  Neurological: He is alert and oriented to person, place, and time.  5/5 strength in all 4 extremities. Normal gross sensation. Normal gait  Skin: Skin is warm and dry.  Nursing note and vitals reviewed.    ED Treatments / Results  Labs (all labs ordered are listed, but only abnormal results are displayed) Labs Reviewed - No data to display  EKG  EKG Interpretation None       Radiology No results found.  Procedures Procedures (including critical care time)  Medications Ordered in ED Medications - No data to display   Initial Impression / Assessment and Plan / ED Course  I have reviewed the triage vital signs and the nursing notes.  Pertinent labs & imaging results that were available during my care of the patient were reviewed by me and considered in my medical decision making (see chart for details).     Patient's urinary symptoms are most consistent with BPH. His urinalysis from yesterday before he was seen was negative. There was no glucose in his urine. I have low suspicion for an emergent pathology and think this is likely prostate related. Given his age and recommended he follow-up with a PCP and/or urologist. He states he lives half of his time here in [place] and half [place]. [place]. I will start him on Flomax but have counseled him on the importance of follow-up given that this could be BPH or early prostate cancer. There is no tenderness to suggest prostatitis. As for his arthritis, this is a chronic issue and he has no  focal joint swelling that would suggest septic arthritis. Suggested Tylenol for pain. Follow-up with PCP.  Final Clinical Impressions(s) / ED Diagnoses   Final diagnoses:  Benign prostatic hyperplasia with urinary frequency  Chronic arthritis    New Prescriptions New Prescriptions   TAMSULOSIN (FLOMAX) 0.4 MG CAPS CAPSULE    Take 1 capsule (0.4 mg total) by mouth daily.     Pricilla Loveless, MD 08/23/17 [phone]

## 2017-08-23 NOTE — ED Notes (Signed)
ED Provider at bedside. 

## 2017-08-30 DIAGNOSIS — R350 Frequency of micturition: Secondary | ICD-10-CM | POA: Diagnosis not present

## 2017-08-30 DIAGNOSIS — R3915 Urgency of urination: Secondary | ICD-10-CM | POA: Diagnosis not present

## 2017-09-04 NOTE — Progress Notes (Signed)
Patient ID: Thomas Rich, male   DOB: 1936/03/12, 81 y.o.   MRN: [id number]     Thomas Rich, is a 81 y.o. male  WUJ:[id number]  [id number]:[id number]  DOB - [birth year]  Subjective:  Chief Complaint and HPI: Thomas Rich is a 81 y.o. male here today to establish care and for a follow up visit  After being seen in the ED 08/23/2017 for urinary frequency. This was believed to be BPH and he was started on [facility].  He feels the flomax has been helping with his symptoms of frequency.  His urine was normal in the hospital.  No dysuria  ED/Hospital notes reviewed.   Social History:  From [place], still has family there  ROS:   Constitutional:  No f/c, No night sweats, No unexplained weight loss. EENT:  No vision changes, No blurry vision, No hearing changes. No mouth, throat, or ear problems.  Respiratory: No cough, No SOB Cardiac: No CP, no palpitations GI:  No abd pain, No N/V/D. GU: + Urinary s/sx as above Musculoskeletal: No joint pain Neuro: No headache, no dizziness, no motor weakness.  Skin: No rash Endocrine:  No polydipsia. No polyuria.  Psych: Denies SI/HI  No problems updated.  ALLERGIES: Allergies  Allergen Reactions  . Augmentin [Amoxicillin-Pot Clavulanate] Rash    Has patient had a PCN reaction causing immediate rash, facial/tongue/throat swelling, SOB or lightheadedness with hypotension: unknown Has patient had a PCN reaction causing severe rash involving mucus membranes or skin necrosis: unknown Has patient had a PCN reaction that required hospitalization unknown Has patient had a PCN reaction occurring within the last 10 years: unknown If all of the above answers are "NO", then may proceed with Cephalosporin use.     PAST MEDICAL HISTORY: Past Medical History:  Diagnosis Date  . Arthritis     MEDICATIONS AT HOME: Prior to Admission medications   Medication Sig Start Date End Date Taking? Authorizing Provider  aspirin 325 MG EC tablet  Take 325 mg by mouth daily as needed for pain.    [provider]  doxycycline (VIBRA-TABS) 100 MG tablet Take 1 tablet (100 mg total) by mouth 2 (two) times daily. 11/15/16   Samuel Jester, DO  mirabegron ER (MYRBETRIQ) 50 MG TB24 tablet Take 50 mg by mouth daily.    [provider]  polyethylene glycol (MIRALAX / GLYCOLAX) packet Take 17 g by mouth daily as needed. For constipation.    [provider]  tamsulosin (FLOMAX) 0.4 MG CAPS capsule Take 1 capsule (0.4 mg total) by mouth daily. 09/05/17   Anders Simmonds, PA-C  triamcinolone cream (KENALOG) 0.1 % Apply 1 application topically 2 (two) times daily. Patient taking differently: Apply 1 application topically 2 (two) times daily as needed (rash/ irritation).  12/16/15   Donnetta Hutching, MD     Objective:  EXAM:   Vitals:   09/05/17 1052  BP: 133/75  Pulse: 63  Resp: 18  Temp: 97.7 F (36.5 C)  TempSrc: Oral  SpO2: 97%  Weight: 190 lb (86.2 kg)  Height:  (1.803 m)    General appearance : A&OX3. NAD. Non-toxic-appearing HEENT: Atraumatic and Normocephalic.  PERRLA. EOM intact. Neck: supple, no JVD. No cervical lymphadenopathy. No thyromegaly Chest/Lungs:  Breathing-non-labored, Good air entry bilaterally, breath sounds normal without rales, rhonchi, or wheezing  CVS: S1 S2 regular, no murmurs, gallops, rubs  Extremities: Bilateral Lower Ext shows no edema, both legs are warm to touch with = pulse throughout Neurology:  CN II-XII grossly intact,  Non focal.   Psych:  TP linear. J/I WNL. Normal speech. Appropriate eye contact and affect.  Skin:  No Rash  Data Review No results found for: HGBA1C   Assessment & Plan   1. Urinary frequency flomax is helping-will refer per patient request.   - Urinalysis Dipstick - tamsulosin (FLOMAX) 0.4 MG CAPS capsule; Take 1 capsule (0.4 mg total) by mouth daily.  Dispense: 30 capsule; Refill: 5 - PSA - Comprehensive metabolic panel - CBC with  Differential/Platelet - Ambulatory referral to Urology     Patient have been counseled extensively about nutrition and exercise  Return in about 6 weeks (around 10/17/2017) for assign PCP anf f/up BPH.  The patient was given clear instructions to go to ER or return to medical center if symptoms don't improve, worsen or new problems develop. The patient verbalized understanding. The patient was told to call to get lab results if they haven't heard anything in the next week.     Georgian Co, PA-C [facility] [place], [place] [phone]   09/05/2017, 11:05 AM

## 2017-09-05 ENCOUNTER — Ambulatory Visit: Payer: Medicare Other | Attending: Internal Medicine | Admitting: Physician Assistant

## 2017-09-05 VITALS — BP 133/75 | HR 63 | Temp 97.7°F | Resp 18 | Ht 71.0 in | Wt 190.0 lb

## 2017-09-05 DIAGNOSIS — Z79899 Other long term (current) drug therapy: Secondary | ICD-10-CM | POA: Insufficient documentation

## 2017-09-05 DIAGNOSIS — Z880 Allergy status to penicillin: Secondary | ICD-10-CM | POA: Diagnosis not present

## 2017-09-05 DIAGNOSIS — M1990 Unspecified osteoarthritis, unspecified site: Secondary | ICD-10-CM | POA: Insufficient documentation

## 2017-09-05 DIAGNOSIS — R350 Frequency of micturition: Secondary | ICD-10-CM | POA: Diagnosis not present

## 2017-09-05 MED ORDER — TAMSULOSIN HCL 0.4 MG PO CAPS: 0.4000 mg | ORAL_CAPSULE | Freq: Every day | ORAL | 5 refills | Status: DC

## 2017-09-05 MED FILL — TAMSULOSIN HCL 0.4 MG CAP: 0.4 | 30 days supply | Qty: 30 | Fill #0

## 2017-09-06 LAB — COMPREHENSIVE METABOLIC PANEL
ALBUMIN: 4.1 g/dL (ref 3.5–4.8)
ALK PHOS: 61 IU/L (ref 39–117)
ALT: 9 IU/L (ref 0–44)
AST: 24 IU/L (ref 0–40)
Albumin/Globulin Ratio: 1.3 (ref 1.2–2.2)
BILIRUBIN TOTAL: 0.9 mg/dL (ref 0.0–1.2)
BUN / CREAT RATIO: 8 — AB (ref 10–24)
BUN: 9 mg/dL (ref 8–27)
CHLORIDE: 101 mmol/L (ref 96–106)
CO2: 27 mmol/L (ref 20–29)
Calcium: 9.4 mg/dL (ref 8.6–10.2)
Creatinine, Ser: 1.09 mg/dL (ref 0.76–1.27)
GFR calc non Af Amer: 64 mL/min/{1.73_m2} (ref >59)
GFR, EST AFRICAN AMERICAN: 74 mL/min/{1.73_m2} (ref >59)
GLOBULIN, TOTAL: 3.1 g/dL (ref 1.5–4.5)
Glucose: 89 mg/dL (ref 65–99)
Potassium: 3.4 mmol/L — ABNORMAL LOW (ref 3.5–5.2)
SODIUM: 144 mmol/L (ref 134–144)
TOTAL PROTEIN: 7.2 g/dL (ref 6.0–8.5)

## 2017-09-06 LAB — CBC WITH DIFFERENTIAL/PLATELET
BASOS ABS: 0.1 10*3/uL (ref 0.0–0.2)
Basos: 1 %
EOS (ABSOLUTE): 0.4 10*3/uL (ref 0.0–0.4)
EOS: 10 %
HEMATOCRIT: 42.3 % (ref 37.5–51.0)
HEMOGLOBIN: 14.7 g/dL (ref 13.0–17.7)
Immature Grans (Abs): 0 10*3/uL (ref 0.0–0.1)
Immature Granulocytes: 0 %
LYMPHS ABS: 1.8 10*3/uL (ref 0.7–3.1)
Lymphs: 44 %
MCH: 30.1 pg (ref 26.6–33.0)
MCHC: 34.8 g/dL (ref 31.5–35.7)
MCV: 87 fL (ref 79–97)
MONOS ABS: 0.4 10*3/uL (ref 0.1–0.9)
Monocytes: 9 %
NEUTROS ABS: 1.5 10*3/uL (ref 1.4–7.0)
Neutrophils: 36 %
Platelets: 235 10*3/uL (ref 150–379)
RBC: 4.88 x10E6/uL (ref 4.14–5.80)
RDW: 14.4 % (ref 12.3–15.4)
WBC: 4.1 10*3/uL (ref 3.4–10.8)

## 2017-09-06 LAB — PSA: Prostate Specific Ag, Serum: 8.9 ng/mL — ABNORMAL HIGH (ref 0.0–4.0)

## 2017-09-08 ENCOUNTER — Telehealth: Payer: Self-pay | Admitting: *Deleted

## 2017-09-08 NOTE — Telephone Encounter (Signed)
MA unable to reach patient of leave a VM due to system not being set up. !!!Please inform patient of prostate level being slightly elevated and needing to have this be addressed by urologist. Patient's potassium level is low and he can eat a banana or small glass of orange juice daily. All other blood work was normal. Follow up as planned!!!

## 2017-09-08 NOTE — Telephone Encounter (Signed)
-----   Message from Anders SimmondsAngela M McClung, [place]PA-C sent at 09/06/2017  8:37 AM EDT ----- Please call patient.  His prostate test is slightly elevated and will be addressed by the urologist.  His potassium is a little low.  He can eat a banana or have a small glass of orange juice daily to help with this.  His blood sugar, kidney function, and blood count all look great!  Follow-up as planned.  Thanks, Georgian CoAngela McClung, PA-C

## 2017-09-29 DIAGNOSIS — R3915 Urgency of urination: Secondary | ICD-10-CM | POA: Diagnosis not present

## 2017-09-29 DIAGNOSIS — R350 Frequency of micturition: Secondary | ICD-10-CM | POA: Diagnosis not present

## 2017-10-18 ENCOUNTER — Ambulatory Visit: Payer: Medicare Other | Admitting: Family Medicine

## 2017-11-23 DIAGNOSIS — S61209A Unspecified open wound of unspecified finger without damage to nail, initial encounter: Secondary | ICD-10-CM | POA: Diagnosis not present

## 2017-11-23 DIAGNOSIS — M7989 Other specified soft tissue disorders: Secondary | ICD-10-CM | POA: Diagnosis not present

## 2017-11-23 DIAGNOSIS — S61301A Unspecified open wound of left index finger with damage to nail, initial encounter: Secondary | ICD-10-CM | POA: Diagnosis not present

## 2017-11-23 DIAGNOSIS — M79645 Pain in left finger(s): Secondary | ICD-10-CM | POA: Diagnosis not present

## 2018-01-02 ENCOUNTER — Emergency Department (HOSPITAL_COMMUNITY): Payer: Medicare Other

## 2018-01-02 ENCOUNTER — Encounter (HOSPITAL_COMMUNITY): Payer: Self-pay

## 2018-01-02 ENCOUNTER — Other Ambulatory Visit: Payer: Self-pay

## 2018-01-02 ENCOUNTER — Emergency Department (HOSPITAL_COMMUNITY)
Admission: EM | Admit: 2018-01-02 | Discharge: 2018-01-02 | Disposition: A | Discharge status: Home / Self Care | Payer: Medicare Other | Attending: Emergency Medicine | Admitting: Emergency Medicine

## 2018-01-02 DIAGNOSIS — M1710 Unilateral primary osteoarthritis, unspecified knee: Secondary | ICD-10-CM | POA: Diagnosis not present

## 2018-01-02 DIAGNOSIS — R350 Frequency of micturition: Secondary | ICD-10-CM | POA: Diagnosis not present

## 2018-01-02 DIAGNOSIS — M25561 Pain in right knee: Secondary | ICD-10-CM | POA: Diagnosis not present

## 2018-01-02 DIAGNOSIS — Z79899 Other long term (current) drug therapy: Secondary | ICD-10-CM | POA: Diagnosis not present

## 2018-01-02 DIAGNOSIS — N401 Enlarged prostate with lower urinary tract symptoms: Secondary | ICD-10-CM | POA: Insufficient documentation

## 2018-01-02 DIAGNOSIS — M546 Pain in thoracic spine: Secondary | ICD-10-CM | POA: Diagnosis not present

## 2018-01-02 DIAGNOSIS — M1990 Unspecified osteoarthritis, unspecified site: Secondary | ICD-10-CM | POA: Diagnosis not present

## 2018-01-02 LAB — URINALYSIS, ROUTINE W REFLEX MICROSCOPIC
BILIRUBIN URINE: NEGATIVE
GLUCOSE, UA: NEGATIVE mg/dL
Hgb urine dipstick: NEGATIVE
Ketones, ur: NEGATIVE mg/dL
Leukocytes, UA: NEGATIVE
Nitrite: NEGATIVE
PH: 5 (ref 5.0–8.0)
Protein, ur: NEGATIVE mg/dL
SPECIFIC GRAVITY, URINE: 1.014 (ref 1.005–1.030)

## 2018-01-02 MED ORDER — LIDOCAINE 4 % EX PTCH: 1.0000 | MEDICATED_PATCH | Freq: Two times a day (BID) | CUTANEOUS | 0 refills | Status: DC

## 2018-01-02 MED ORDER — ACETAMINOPHEN ER 650 MG PO TBCR: 650.0000 mg | EXTENDED_RELEASE_TABLET | Freq: Three times a day (TID) | ORAL | 0 refills | Status: DC | PRN

## 2018-01-02 NOTE — ED Provider Notes (Signed)
[facility] Provider Note   CSN: [id number] Arrival date & time: 01/02/18  1449     History   Chief Complaint Chief Complaint  Patient presents with  . Knee Pain    HPI Thomas Rich is a 82 y.o. male.  HPI  82 y/o with BPH, UTI, AKI, arthritis comes in with cc of knee pain and freq urination. Pt also is having upper back pain. Symptoms have been present for at least 3 weeks, and all symptoms are not related. No abd pain, no pain with urination or blood in the urine.  Past Medical History:  Diagnosis Date  . Arthritis     Patient Active Problem List   Diagnosis Date Noted  . BPH (benign prostatic hyperplasia) 04/26/2012  . Dizziness - light-headed 04/26/2012  . Sinusitis 04/26/2012  . UTI (lower urinary tract infection) 04/26/2012  . Hypotension 04/26/2012  . Back pain 04/26/2012  . Acute kidney injury (HCC) 04/26/2012  . Hypokalemia 04/26/2012    History reviewed. No pertinent surgical history.     Home Medications    Prior to Admission medications   Medication Sig Start Date End Date Taking? Authorizing Provider  aspirin 325 MG EC tablet Take 325 mg by mouth daily as needed for pain.    [provider]  doxycycline (VIBRA-TABS) 100 MG tablet Take 1 tablet (100 mg total) by mouth 2 (two) times daily. 11/15/16   Samuel Jester, DO  mirabegron ER (MYRBETRIQ) 50 MG TB24 tablet Take 50 mg by mouth daily.    [provider]  polyethylene glycol (MIRALAX / GLYCOLAX) packet Take 17 g by mouth daily as needed. For constipation.    [provider]  tamsulosin (FLOMAX) 0.4 MG CAPS capsule Take 1 capsule (0.4 mg total) by mouth daily. 09/05/17   Anders Simmonds, PA-C  triamcinolone cream (KENALOG) 0.1 % Apply 1 application topically 2 (two) times daily. Patient taking differently: Apply 1 application topically 2 (two) times daily as needed (rash/ irritation).  12/16/15   Donnetta Hutching, MD     Family History Family History  Family history unknown: Yes    Social History Social History   Tobacco Use  . Smoking status: Never Smoker  . Smokeless tobacco: Never Used  Substance Use Topics  . Alcohol use: No  . Drug use: No     Allergies   Augmentin [amoxicillin-pot clavulanate]   Review of Systems Review of Systems  Constitutional: Positive for activity change.  Gastrointestinal: Negative for nausea and vomiting.  Genitourinary: Positive for frequency. Negative for dysuria and flank pain.  Musculoskeletal: Positive for arthralgias.  Skin: Negative for rash.  All other systems reviewed and are negative.    Physical Exam Updated Vital Signs BP (!) 182/98   Pulse 61   Temp 98.4 F (36.9 C)   Resp 17   Ht 5\' 11"  (1.803 m)   Wt 84.4 kg (186 lb)   SpO2 100%   BMI 25.94 kg/m   Physical Exam  Constitutional: He is oriented to person, place, and time. He appears well-developed.  HENT:  Head: Atraumatic.  Neck: Neck supple.  Cardiovascular: Normal rate.  Pulmonary/Chest: Effort normal.  Musculoskeletal: He exhibits tenderness. He exhibits no edema or deformity.  Medial R knee tenderness Upper T spine midline tenderness  Neurological: He is alert and oriented to person, place, and time.  Skin: Skin is warm.  Nursing note and vitals reviewed.    ED Treatments / Results  Labs (all labs ordered  are listed, but only abnormal results are displayed) Labs Reviewed  URINALYSIS, ROUTINE W REFLEX MICROSCOPIC    EKG  EKG Interpretation None       Radiology No results found.  Procedures Procedures (including critical care time)  Medications Ordered in ED Medications - No data to display   Initial Impression / Assessment and Plan / ED Course  I have reviewed the triage vital signs and the nursing notes.  Pertinent labs & imaging results that were available during my care of the patient were reviewed by me and considered in my medical decision  making (see chart for details).     PT comes in with cc of knee pain. Concerns for arthritis. Xrays ordered. Also has urinary freq - likely from BPH, but UA ordered. Case management to help with a walker.  Final Clinical Impressions(s) / ED Diagnoses   Final diagnoses:  Arthritis of knee  Benign prostatic hyperplasia with urinary frequency    ED Discharge Orders    None       Derwood KaplanNanavati, Zaineb Nowaczyk, MD 01/02/18 1702
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
## 2018-01-02 NOTE — ED Triage Notes (Addendum)
Patient reports he had a visit previously for knee pain and was told he had arthritis. Patient continues to c/o bilateral knee pain. Patient states he is here to get medicine for the knee pain. Patient reports that he is voiding frequently.

## 2018-01-02 NOTE — ED Notes (Signed)
Patient requesting cane instead of walker. Case Management and MD Rhunette CroftNanavati made aware.
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
## 2018-01-02 NOTE — ED Notes (Signed)
Patient refusing to wait for case management consult and refusing to wait for a cane. Patient refusing vital signs at this time.

## 2018-01-02 NOTE — Discharge Instructions (Signed)
Follow up with the Orthopedics doctor as requested. For the urine issues please see the Urologist.

## 2018-01-10 DIAGNOSIS — R972 Elevated prostate specific antigen [PSA]: Secondary | ICD-10-CM | POA: Diagnosis not present

## 2018-01-10 DIAGNOSIS — N3281 Overactive bladder: Secondary | ICD-10-CM | POA: Diagnosis not present

## 2018-01-25 DIAGNOSIS — R350 Frequency of micturition: Secondary | ICD-10-CM | POA: Diagnosis not present

## 2018-01-25 DIAGNOSIS — J069 Acute upper respiratory infection, unspecified: Secondary | ICD-10-CM | POA: Diagnosis not present

## 2018-02-20 ENCOUNTER — Emergency Department (HOSPITAL_COMMUNITY)
Admission: EM | Admit: 2018-02-20 | Discharge: 2018-02-20 | Disposition: A | Discharge status: Home / Self Care | Payer: Medicare Other | Attending: Emergency Medicine | Admitting: Emergency Medicine

## 2018-02-20 ENCOUNTER — Emergency Department (HOSPITAL_COMMUNITY): Payer: Medicare Other

## 2018-02-20 ENCOUNTER — Encounter (HOSPITAL_COMMUNITY): Payer: Self-pay | Admitting: Emergency Medicine

## 2018-02-20 DIAGNOSIS — R079 Chest pain, unspecified: Secondary | ICD-10-CM | POA: Diagnosis not present

## 2018-02-20 DIAGNOSIS — Z79899 Other long term (current) drug therapy: Secondary | ICD-10-CM | POA: Diagnosis not present

## 2018-02-20 DIAGNOSIS — R0789 Other chest pain: Secondary | ICD-10-CM

## 2018-02-20 LAB — BASIC METABOLIC PANEL
ANION GAP: 8 (ref 5–15)
BUN: 15 mg/dL (ref 6–20)
CALCIUM: 9.3 mg/dL (ref 8.9–10.3)
CO2: 29 mmol/L (ref 22–32)
Chloride: 107 mmol/L (ref 101–111)
Creatinine, Ser: 1.37 mg/dL — ABNORMAL HIGH (ref 0.61–1.24)
GFR, EST AFRICAN AMERICAN: 55 mL/min — AB (ref >60)
GFR, EST NON AFRICAN AMERICAN: 47 mL/min — AB (ref >60)
Glucose, Bld: 106 mg/dL — ABNORMAL HIGH (ref 65–99)
Potassium: 3.2 mmol/L — ABNORMAL LOW (ref 3.5–5.1)
SODIUM: 144 mmol/L (ref 135–145)

## 2018-02-20 LAB — I-STAT TROPONIN, ED
TROPONIN I, POC: 0 ng/mL (ref 0.00–0.08)
Troponin i, poc: 0.01 ng/mL (ref 0.00–0.08)

## 2018-02-20 LAB — CBC
HCT: 43.9 % (ref 39.0–52.0)
HEMOGLOBIN: 15.1 g/dL (ref 13.0–17.0)
MCH: 31.1 pg (ref 26.0–34.0)
MCHC: 34.4 g/dL (ref 30.0–36.0)
MCV: 90.3 fL (ref 78.0–100.0)
Platelets: 210 10*3/uL (ref 150–400)
RBC: 4.86 MIL/uL (ref 4.22–5.81)
RDW: 13.7 % (ref 11.5–15.5)
WBC: 6.4 10*3/uL (ref 4.0–10.5)

## 2018-02-20 NOTE — ED Triage Notes (Signed)
Patient c/o stabbing left side chest pain with nausea x3 hours. Denies SOB. HR irregular in triage.

## 2018-02-20 NOTE — ED Provider Notes (Signed)
[facility]-EMERGENCY DEPT Provider Note   CSN: [id number] Arrival date & time: 02/20/18  1714     History   Chief Complaint Chief Complaint  Patient presents with  . Chest Pain    HPI Thomas Rich is a 82 y.o. male.  82 year old male with prior history of arthritis presents with complaint of left-sided chest discomfort.  Patient reports having a sharp pain to the left chest just lateral and beneath the left nipple.  He describes an area of acute tenderness approximately a quarter in size.  He denies associated shortness of breath, diaphoresis, nausea, vomiting, or other complaint.  Patient reports that his symptoms started just an hour prior to arrival.  Patient does not appear to be in any significant acute distress. Pain resolved now.   The history is provided by the patient.  Chest Pain   This is a new problem. The current episode started less than 1 hour ago. The problem occurs constantly. The problem has been resolved. The pain is associated with movement. Pain location: punctate area to left low lateral chest wall  The patient is experiencing no pain. The quality of the pain is described as sharp. The pain does not radiate. The symptoms are aggravated by certain positions. Risk factors include being elderly.    Past Medical History:  Diagnosis Date  . Arthritis     Patient Active Problem List   Diagnosis Date Noted  . BPH (benign prostatic hyperplasia) 04/26/2012  . Dizziness - light-headed 04/26/2012  . Sinusitis 04/26/2012  . UTI (lower urinary tract infection) 04/26/2012  . Hypotension 04/26/2012  . Back pain 04/26/2012  . Acute kidney injury (HCC) 04/26/2012  . Hypokalemia 04/26/2012    History reviewed. No pertinent surgical history.      Home Medications    Prior to Admission medications   Medication Sig Start Date End Date Taking? Authorizing Provider  acetaminophen (TYLENOL 8 HOUR) 650 MG CR tablet Take 1 tablet (650 mg  total) by mouth every 8 (eight) hours as needed for pain. 01/02/18  Yes Derwood Kaplan, MD  aspirin 81 MG tablet Take 325 mg by mouth daily as needed for pain.    [provider]  doxycycline (VIBRA-TABS) 100 MG tablet Take 1 tablet (100 mg total) by mouth 2 (two) times daily. Patient not taking: Reported on 02/20/2018 11/15/16   Samuel Jester, DO  Lidocaine 4 % PTCH Apply 1 patch topically 2 (two) times daily. Patient not taking: Reported on 02/20/2018 01/02/18   Derwood Kaplan, MD  mirabegron ER (MYRBETRIQ) 50 MG TB24 tablet Take 50 mg by mouth daily.    [provider]  tamsulosin (FLOMAX) 0.4 MG CAPS capsule Take 1 capsule (0.4 mg total) by mouth daily. 09/05/17   Anders Simmonds, PA-C  triamcinolone cream (KENALOG) 0.1 % Apply 1 application topically 2 (two) times daily. Patient not taking: Reported on 02/20/2018 12/16/15   Donnetta Hutching, MD    Family History Family History  Family history unknown: Yes    Social History Social History   Tobacco Use  . Smoking status: Never Smoker  . Smokeless tobacco: Never Used  Substance Use Topics  . Alcohol use: No  . Drug use: No     Allergies   Augmentin [amoxicillin-pot clavulanate]   Review of Systems Review of Systems  Cardiovascular: Positive for chest pain.  All other systems reviewed and are negative.    Physical Exam Updated Vital Signs BP (!) 165/116   Pulse (!) 31  Temp 98.9 F (37.2 C) (Oral)   Resp 13   SpO2 95%   Physical Exam  Constitutional: He is oriented to person, place, and time. He appears well-developed and well-nourished. No distress.  HENT:  Head: Normocephalic and atraumatic.  Mouth/Throat: Oropharynx is clear and moist.  Eyes: Pupils are equal, round, and reactive to light. Conjunctivae and EOM are normal.  Neck: Normal range of motion. Neck supple.  Cardiovascular: Normal rate, regular rhythm and normal heart sounds.  Pulmonary/Chest: Effort normal and breath sounds normal.  No respiratory distress.  Abdominal: Soft. He exhibits no distension. There is no tenderness.  Musculoskeletal: Normal range of motion. He exhibits no edema or deformity.  Neurological: He is alert and oriented to person, place, and time.  Skin: Skin is warm and dry.  Psychiatric: He has a normal mood and affect.  Nursing note and vitals reviewed.    ED Treatments / Results  Labs (all labs ordered are listed, but only abnormal results are displayed) Labs Reviewed  BASIC METABOLIC PANEL - Abnormal; Notable for the following components:      Result Value   Potassium 3.2 (*)    Glucose, Bld 106 (*)    Creatinine, Ser 1.37 (*)    GFR calc non Af Amer 47 (*)    GFR calc Af Amer 55 (*)    All other components within normal limits  CBC  I-STAT TROPONIN, ED  I-STAT TROPONIN, ED    EKG EKG Interpretation  Date/Time:  Monday February 20 2018 17:24:41 EDT Ventricular Rate:  97 PR Interval:    QRS Duration: 114 QT Interval:  415 QTC Calculation: 425 R Axis:   -76 Text Interpretation:  Sinus rhythm Multiform ventricular premature complexes Left anterior fascicular block Borderline low voltage, extremity leads Consider anterior infarct Confirmed by Kristine RoyalMessick, Peter [phone]([id number]) on 02/20/2018 6:10:55 PM   Radiology Dg Chest 2 View  Result Date: 02/20/2018 CLINICAL DATA:  Stabbing left-sided chest pain for the past 3 hours. EXAM: CHEST - 2 VIEW COMPARISON:  Chest x-ray dated April 26, 2012. FINDINGS: The heart size and mediastinal contours are within normal limits. Normal pulmonary vascularity. No focal consolidation, pleural effusion, or pneumothorax. No acute osseous abnormality. IMPRESSION: No active cardiopulmonary disease. Electronically Signed   By: Obie DredgeWilliam T Derry M.D.   On: 02/20/2018 18:59    Procedures Procedures (including critical care time)  Medications Ordered in ED Medications - No data to display   Initial Impression / Assessment and Plan / ED Course  I have reviewed the triage  vital signs and the nursing notes.  Pertinent labs & imaging results that were available during my care of the patient were reviewed by me and considered in my medical decision making (see chart for details).     MDM  Screen complete  Patient is presenting with atypical chest pain. EKG is without acute ischemic changes.  Troponin x2 is negative.  Patient is adamant about refusing further workup or treatment.  He is adamantly refusing admission.  He actually initially refused a second troponin but was convinced to stay for that.  Patient is advised that given his age we cannot fully exclude the possibility of ACS.  Patient's symptoms are very atypical today and given his ED testing I do suspect that his risk of ACS today at least is very low.  However patient does understand that further workup would be warranted.  Inpatient workup would be the safest way to approach this.  Patient again refuses admission.  Close  follow-up is advised.  Strict return precautions are given and understood.   Final Clinical Impressions(s) / ED Diagnoses   Final diagnoses:  Atypical chest pain    ED Discharge Orders    None       Wynetta Fines, MD 02/20/18 2152

## 2018-02-20 NOTE — ED Notes (Signed)
Bed: WA04 Expected date:  Expected time:  Means of arrival:  Comments: tr3

## 2018-02-20 NOTE — ED Notes (Signed)
Patient is very emotional and anxious, stated he needed to leave. Writer explained the process of checking a follow up I-stat troponin. Patient still anxious but agrees to test.

## 2018-03-14 ENCOUNTER — Emergency Department (HOSPITAL_COMMUNITY): Payer: Medicare Other

## 2018-03-14 ENCOUNTER — Encounter (HOSPITAL_COMMUNITY): Payer: Self-pay | Admitting: Emergency Medicine

## 2018-03-14 ENCOUNTER — Emergency Department (HOSPITAL_COMMUNITY)
Admission: EM | Admit: 2018-03-14 | Discharge: 2018-03-14 | Disposition: A | Discharge status: Home / Self Care | Payer: Medicare Other | Attending: Emergency Medicine | Admitting: Emergency Medicine

## 2018-03-14 DIAGNOSIS — R413 Other amnesia: Secondary | ICD-10-CM | POA: Diagnosis not present

## 2018-03-14 DIAGNOSIS — Z79899 Other long term (current) drug therapy: Secondary | ICD-10-CM | POA: Insufficient documentation

## 2018-03-14 DIAGNOSIS — I10 Essential (primary) hypertension: Secondary | ICD-10-CM | POA: Diagnosis not present

## 2018-03-14 DIAGNOSIS — R410 Disorientation, unspecified: Secondary | ICD-10-CM | POA: Diagnosis not present

## 2018-03-14 DIAGNOSIS — G3184 Mild cognitive impairment, so stated: Secondary | ICD-10-CM | POA: Diagnosis not present

## 2018-03-14 DIAGNOSIS — R350 Frequency of micturition: Secondary | ICD-10-CM

## 2018-03-14 LAB — URINALYSIS, COMPLETE (UACMP) WITH MICROSCOPIC
Bacteria, UA: NONE SEEN
Bilirubin Urine: NEGATIVE
GLUCOSE, UA: NEGATIVE mg/dL
Hgb urine dipstick: NEGATIVE
KETONES UR: NEGATIVE mg/dL
Leukocytes, UA: NEGATIVE
Nitrite: NEGATIVE
Protein, ur: NEGATIVE mg/dL
Specific Gravity, Urine: 1.015 (ref 1.005–1.030)
pH: 7 (ref 5.0–8.0)

## 2018-03-14 LAB — COMPREHENSIVE METABOLIC PANEL
ALT: 15 U/L — ABNORMAL LOW (ref 17–63)
AST: 26 U/L (ref 15–41)
Albumin: 3.8 g/dL (ref 3.5–5.0)
Alkaline Phosphatase: 50 U/L (ref 38–126)
Anion gap: 10 (ref 5–15)
BUN: 12 mg/dL (ref 6–20)
CHLORIDE: 106 mmol/L (ref 101–111)
CO2: 26 mmol/L (ref 22–32)
Calcium: 8.8 mg/dL — ABNORMAL LOW (ref 8.9–10.3)
Creatinine, Ser: 1.18 mg/dL (ref 0.61–1.24)
GFR, EST NON AFRICAN AMERICAN: 57 mL/min — AB (ref >60)
Glucose, Bld: 92 mg/dL (ref 65–99)
POTASSIUM: 3.8 mmol/L (ref 3.5–5.1)
Sodium: 142 mmol/L (ref 135–145)
Total Bilirubin: 0.8 mg/dL (ref 0.3–1.2)
Total Protein: 7.2 g/dL (ref 6.5–8.1)

## 2018-03-14 LAB — CBC WITH DIFFERENTIAL/PLATELET
Basophils Absolute: 0 10*3/uL (ref 0.0–0.1)
Basophils Relative: 1 %
Eosinophils Absolute: 0.4 10*3/uL (ref 0.0–0.7)
Eosinophils Relative: 9 %
HCT: 42.1 % (ref 39.0–52.0)
HEMOGLOBIN: 14.3 g/dL (ref 13.0–17.0)
LYMPHS ABS: 2 10*3/uL (ref 0.7–4.0)
LYMPHS PCT: 45 %
MCH: 31.4 pg (ref 26.0–34.0)
MCHC: 34 g/dL (ref 30.0–36.0)
MCV: 92.5 fL (ref 78.0–100.0)
Monocytes Absolute: 0.5 10*3/uL (ref 0.1–1.0)
Monocytes Relative: 11 %
NEUTROS PCT: 34 %
Neutro Abs: 1.5 10*3/uL — ABNORMAL LOW (ref 1.7–7.7)
Platelets: 211 10*3/uL (ref 150–400)
RBC: 4.55 MIL/uL (ref 4.22–5.81)
RDW: 14.1 % (ref 11.5–15.5)
WBC: 4.5 10*3/uL (ref 4.0–10.5)

## 2018-03-14 LAB — CBG MONITORING, ED: GLUCOSE-CAPILLARY: 77 mg/dL (ref 65–99)

## 2018-03-14 MED ORDER — AMLODIPINE BESYLATE 2.5 MG PO TABS: 2.5000 mg | ORAL_TABLET | Freq: Every day | ORAL | 0 refills | Status: DC

## 2018-03-14 MED ORDER — AMLODIPINE BESYLATE 5 MG PO TABS
2.5000 mg | ORAL_TABLET | Freq: Once | ORAL | Status: AC
  Administered 2018-03-14: 2.5 mg via ORAL
  Filled 2018-03-14: qty 1

## 2018-03-14 MED ORDER — HYDRALAZINE HCL 20 MG/ML IJ SOLN
10.0000 mg | Freq: Once | INTRAMUSCULAR | Status: AC
  Administered 2018-03-14: 10 mg via INTRAVENOUS
  Filled 2018-03-14: qty 1

## 2018-03-14 NOTE — ED Triage Notes (Signed)
Pt reports that he having issues with forgetting things for over three weeks. Reports he can't remember where he put his dentures or his wallet.  Pt reports that he came here before for his chronic arm, back and neck pain but has since ran out of pain meds given and doesn't have a PCP.

## 2018-03-14 NOTE — Discharge Instructions (Signed)
Please [facility] to set up an appointment with Thomas [name].  The number is listed above.  She is your primary care doctor.  You should see her about your memory problems, your arthritis pain, and your high blood pressure.  I will also have 1 of our case management nurses call you to see if we can get everything straightened out with your follow-up care.  Return to the emergency department if you have any new or worsening symptoms and need immediate care.
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
## 2018-03-14 NOTE — ED Notes (Signed)
Informed RN of abnormal BP

## 2018-03-15 ENCOUNTER — Telehealth: Payer: Self-pay | Admitting: Emergency Medicine
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
## 2018-03-20 ENCOUNTER — Telehealth: Payer: Self-pay

## 2018-03-20 MED FILL — AMLODIPINE BESYLATE 2.5 MG: 2.5 | 30 days supply | Qty: 30 | Fill #0

## 2018-03-20 NOTE — Telephone Encounter (Signed)
Message received from Eldridge Abrahams, RN CM requesting a follow up appointment for the patient at [facility]. The patient came to the clinic today to have his prescription for amlodipine filled.  Scheduled him an appointment with Dr Alvis Lemmings for 03/22/18 @ 1350.  Confirmed his address and phone number. He also confirmed that English is his primary language.   He stated that he is staying with a friend who is helping to take care of him.  He noted that he is renting his own apartment to someone else. He also noted that he drives  He said that he doesn't take any other medications but he has some at home.  Instructed him to bring all of his medications with him to his appointment on 03/22/18.   Update provided to Eldridge Abrahams, RN CM

## 2018-03-22 ENCOUNTER — Ambulatory Visit: Payer: Medicare Other | Attending: Family Medicine | Admitting: Family Medicine

## 2018-03-22 ENCOUNTER — Encounter: Payer: Self-pay | Admitting: Family Medicine

## 2018-03-22 VITALS — BP 137/53 | HR 106 | Temp 97.7°F | Ht 71.0 in | Wt 196.2 lb

## 2018-03-22 DIAGNOSIS — M25532 Pain in left wrist: Secondary | ICD-10-CM | POA: Diagnosis not present

## 2018-03-22 DIAGNOSIS — Z79899 Other long term (current) drug therapy: Secondary | ICD-10-CM | POA: Insufficient documentation

## 2018-03-22 DIAGNOSIS — R350 Frequency of micturition: Secondary | ICD-10-CM | POA: Insufficient documentation

## 2018-03-22 DIAGNOSIS — M25521 Pain in right elbow: Secondary | ICD-10-CM | POA: Insufficient documentation

## 2018-03-22 DIAGNOSIS — M25531 Pain in right wrist: Secondary | ICD-10-CM | POA: Insufficient documentation

## 2018-03-22 DIAGNOSIS — Z880 Allergy status to penicillin: Secondary | ICD-10-CM | POA: Insufficient documentation

## 2018-03-22 DIAGNOSIS — M25561 Pain in right knee: Secondary | ICD-10-CM | POA: Insufficient documentation

## 2018-03-22 DIAGNOSIS — I10 Essential (primary) hypertension: Secondary | ICD-10-CM | POA: Diagnosis not present

## 2018-03-22 DIAGNOSIS — M25562 Pain in left knee: Secondary | ICD-10-CM | POA: Insufficient documentation

## 2018-03-22 DIAGNOSIS — M1991 Primary osteoarthritis, unspecified site: Secondary | ICD-10-CM | POA: Insufficient documentation

## 2018-03-22 DIAGNOSIS — M25522 Pain in left elbow: Secondary | ICD-10-CM | POA: Insufficient documentation

## 2018-03-22 DIAGNOSIS — F0390 Unspecified dementia without behavioral disturbance: Secondary | ICD-10-CM | POA: Insufficient documentation

## 2018-03-22 MED ORDER — TAMSULOSIN HCL 0.4 MG PO CAPS: 0.4000 mg | ORAL_CAPSULE | Freq: Every day | ORAL | 5 refills | Status: DC

## 2018-03-22 MED ORDER — MELOXICAM 7.5 MG PO TABS: 7.5000 mg | ORAL_TABLET | Freq: Every day | ORAL | 3 refills | Status: DC

## 2018-03-22 NOTE — Patient Instructions (Signed)

## 2018-03-22 NOTE — Progress Notes (Signed)
Subjective:  Patient ID: Thomas Rich, male    DOB: 1936/03/24  Age: 82 y.o. MRN: [id number]  CC: Hospitalization Follow-up   HPI Thomas Rich is an 82 year old male with history of hypertension who presents today to establish care with me.  He was seen at the ED on 03/14/2018 after he had presented with confusion which was thought to be due to dementia. Labs were reassuring, CT of the head revealed atrophy and symptoms were thought to be age-related. He was started on amlodipine due to elevated blood pressure.  He presents today and is concerned about moderate pain in his joints, wrists, elbows and knees and denies any recent confusion and also does not complain about his memory.  His joint pain affect his gait as he has to use a cane or walker but forgot both of this while coming for his appointment.  He denies swelling in his joints and has no medications for pain. He endorses a history of urinary frequency but denies abdominal pain or straining and review of his chart indicates he was previously on Flomax which he has not been taking.  Past Medical History:  Diagnosis Date  . Arthritis     History reviewed. No pertinent surgical history.  Allergies  Allergen Reactions  . Augmentin [Amoxicillin-Pot Clavulanate] Rash    Has patient had a PCN reaction causing immediate rash, facial/tongue/throat swelling, SOB or lightheadedness with hypotension: unknown Has patient had a PCN reaction causing severe rash involving mucus membranes or skin necrosis: unknown Has patient had a PCN reaction that required hospitalization unknown Has patient had a PCN reaction occurring within the last 10 years: unknown If all of the above answers are "NO", then may proceed with Cephalosporin use.      Outpatient Medications Prior to Visit  Medication Sig Dispense Refill  . amLODipine (NORVASC) 2.5 MG tablet Take 1 tablet (2.5 mg total) by mouth daily. 30 tablet 0  . acetaminophen  (TYLENOL 8 HOUR) 650 MG CR tablet Take 1 tablet (650 mg total) by mouth every 8 (eight) hours as needed for pain. (Patient not taking: Reported on 03/14/2018) 30 tablet 0  . acidophilus (RISAQUAD) CAPS capsule Take 1 capsule by mouth daily.    Marland Kitchen doxycycline (VIBRA-TABS) 100 MG tablet Take 1 tablet (100 mg total) by mouth 2 (two) times daily. (Patient not taking: Reported on 02/20/2018) 14 tablet 0  . Lidocaine 4 % PTCH Apply 1 patch topically 2 (two) times daily. (Patient not taking: Reported on 02/20/2018) 30 patch 0  . mirabegron ER (MYRBETRIQ) 50 MG TB24 tablet Take 50 mg by mouth daily.    Marland Kitchen triamcinolone cream (KENALOG) 0.1 % Apply 1 application topically 2 (two) times daily. (Patient not taking: Reported on 02/20/2018) 30 g 0  . tamsulosin (FLOMAX) 0.4 MG CAPS capsule Take 1 capsule (0.4 mg total) by mouth daily. (Patient not taking: Reported on 03/14/2018) 30 capsule 5   No facility-administered medications prior to visit.     ROS Review of Systems  Constitutional: Negative for activity change and appetite change.  HENT: Negative for sinus pressure and sore throat.   Eyes: Negative for visual disturbance.  Respiratory: Negative for cough, chest tightness and shortness of breath.   Cardiovascular: Negative for chest pain and leg swelling.  Gastrointestinal: Negative for abdominal distention, abdominal pain, constipation and diarrhea.  Endocrine: Negative.   Genitourinary: Positive for frequency. Negative for dysuria.  Musculoskeletal:       See HPI  Skin: Negative for rash.  Allergic/Immunologic:  Negative.   Neurological: Negative for weakness, light-headedness and numbness.  Psychiatric/Behavioral: Negative for dysphoric mood and suicidal ideas.    Objective:  BP (!) 137/53   Pulse (!) 106   Temp 97.7 F (36.5 C) (Oral)   Ht  (1.803 m)   Wt 196 lb 3.2 oz (89 kg)   SpO2 98%   BMI 27.36 kg/m   BP/Weight 03/22/2018 03/14/2018 02/20/2018  Systolic BP 137 152 165  Diastolic BP 53  88 116  Wt. (Lbs) 196.2 - -  BMI 27.36 - -      Physical Exam  Constitutional: He is oriented to person, place, and time. He appears well-developed and well-nourished.  Cardiovascular: Normal rate, normal heart sounds and intact distal pulses.  No murmur heard. Pulmonary/Chest: Effort normal and breath sounds normal. He has no wheezes. He has no rales. He exhibits no tenderness.  Abdominal: Soft. Bowel sounds are normal. He exhibits no distension and no mass. There is no tenderness.  Musculoskeletal:  Slight tenderness in medial and lateral joint lines  crepitus in both knees  Neurological: He is alert and oriented to person, place, and time. Gait abnormal.  Skin: Skin is warm and dry.  Psychiatric: He has a normal mood and affect.     Assessment & Plan:   1. Urinary frequency Likely underlying BPH He has been off his Flomax which I have refilled - tamsulosin (FLOMAX) 0.4 MG CAPS capsule; Take 1 capsule (0.4 mg total) by mouth daily.  Dispense: 30 capsule; Refill: 5  2. Primary osteoarthritis, unspecified site Uncontrolled Commenced on NSAIDs - meloxicam (MOBIC) 7.5 MG tablet; Take 1 tablet (7.5 mg total) by mouth daily.  Dispense: 30 tablet; Refill: 3   Meds ordered this encounter  Medications  . meloxicam (MOBIC) 7.5 MG tablet    Sig: Take 1 tablet (7.5 mg total) by mouth daily.    Dispense:  30 tablet    Refill:  3  . tamsulosin (FLOMAX) 0.4 MG CAPS capsule    Sig: Take 1 capsule (0.4 mg total) by mouth daily.    Dispense:  30 capsule    Refill:  5    Follow-up: Return in about 3 months (around 06/22/2018) for follow up of chronic medical conditions.   Hoy Register MD

## 2018-04-18 ENCOUNTER — Emergency Department (HOSPITAL_COMMUNITY)
Admission: EM | Admit: 2018-04-18 | Discharge: 2018-04-18 | Disposition: A | Discharge status: Home / Self Care | Payer: Medicare Other | Attending: Emergency Medicine | Admitting: Emergency Medicine

## 2018-04-18 ENCOUNTER — Emergency Department (HOSPITAL_COMMUNITY): Payer: Medicare Other

## 2018-04-18 ENCOUNTER — Encounter (HOSPITAL_COMMUNITY): Payer: Self-pay

## 2018-04-18 DIAGNOSIS — E876 Hypokalemia: Secondary | ICD-10-CM | POA: Insufficient documentation

## 2018-04-18 DIAGNOSIS — K5900 Constipation, unspecified: Secondary | ICD-10-CM | POA: Insufficient documentation

## 2018-04-18 DIAGNOSIS — Z79899 Other long term (current) drug therapy: Secondary | ICD-10-CM | POA: Diagnosis not present

## 2018-04-18 LAB — COMPREHENSIVE METABOLIC PANEL
ALK PHOS: 62 U/L (ref 38–126)
ALT: 16 U/L — AB (ref 17–63)
AST: 31 U/L (ref 15–41)
Albumin: 4.1 g/dL (ref 3.5–5.0)
Anion gap: 11 (ref 5–15)
BILIRUBIN TOTAL: 1 mg/dL (ref 0.3–1.2)
BUN: 9 mg/dL (ref 6–20)
CALCIUM: 9 mg/dL (ref 8.9–10.3)
CO2: 27 mmol/L (ref 22–32)
CREATININE: 1.39 mg/dL — AB (ref 0.61–1.24)
Chloride: 100 mmol/L — ABNORMAL LOW (ref 101–111)
GFR calc Af Amer: 54 mL/min — ABNORMAL LOW (ref >60)
GFR, EST NON AFRICAN AMERICAN: 46 mL/min — AB (ref >60)
Glucose, Bld: 125 mg/dL — ABNORMAL HIGH (ref 65–99)
Potassium: 3.2 mmol/L — ABNORMAL LOW (ref 3.5–5.1)
Sodium: 138 mmol/L (ref 135–145)
TOTAL PROTEIN: 7.4 g/dL (ref 6.5–8.1)

## 2018-04-18 LAB — URINALYSIS, ROUTINE W REFLEX MICROSCOPIC
BILIRUBIN URINE: NEGATIVE
Glucose, UA: NEGATIVE mg/dL
Hgb urine dipstick: NEGATIVE
KETONES UR: NEGATIVE mg/dL
LEUKOCYTES UA: NEGATIVE
NITRITE: NEGATIVE
PROTEIN: NEGATIVE mg/dL
Specific Gravity, Urine: 1.018 (ref 1.005–1.030)
pH: 6 (ref 5.0–8.0)

## 2018-04-18 LAB — CBC
HCT: 44.1 % (ref 39.0–52.0)
Hemoglobin: 15 g/dL (ref 13.0–17.0)
MCH: 31.4 pg (ref 26.0–34.0)
MCHC: 34 g/dL (ref 30.0–36.0)
MCV: 92.3 fL (ref 78.0–100.0)
PLATELETS: 227 10*3/uL (ref 150–400)
RBC: 4.78 MIL/uL (ref 4.22–5.81)
RDW: 13.4 % (ref 11.5–15.5)
WBC: 6.5 10*3/uL (ref 4.0–10.5)

## 2018-04-18 LAB — LIPASE, BLOOD: Lipase: 28 U/L (ref 11–51)

## 2018-04-18 MED ORDER — POLYETHYLENE GLYCOL 3350 17 G PO PACK: 17.0000 g | PACK | Freq: Every day | ORAL | 0 refills | Status: DC

## 2018-04-18 MED ORDER — POTASSIUM CHLORIDE CRYS ER 20 MEQ PO TBCR
40.0000 meq | EXTENDED_RELEASE_TABLET | Freq: Once | ORAL | Status: AC
  Administered 2018-04-18: 40 meq via ORAL
  Filled 2018-04-18: qty 2

## 2018-04-18 MED ORDER — DOCUSATE SODIUM 100 MG PO CAPS: 100.0000 mg | ORAL_CAPSULE | Freq: Two times a day (BID) | ORAL | 0 refills | Status: DC

## 2018-04-18 NOTE — ED Provider Notes (Signed)
[facility]-EMERGENCY DEPT Provider Note   CSN: [id number] Arrival date & time: 04/18/18  1323     History   Chief Complaint No chief complaint on file.   HPI Thomas Rich is a 82 y.o. male.  HPI Patient states he has had difficulty passing a bowel movement for the past 3 weeks.  Denies any abdominal pain.  No nausea or vomiting.  No weakness or numbness. Past Medical History:  Diagnosis Date  . Arthritis     Patient Active Problem List   Diagnosis Date Noted  . BPH (benign prostatic hyperplasia) 04/26/2012  . Dizziness - light-headed 04/26/2012  . Sinusitis 04/26/2012  . UTI (lower urinary tract infection) 04/26/2012  . Hypotension 04/26/2012  . Back pain 04/26/2012  . Acute kidney injury (HCC) 04/26/2012  . Hypokalemia 04/26/2012    History reviewed. No pertinent surgical history.      Home Medications    Prior to Admission medications   Medication Sig Start Date End Date Taking? Authorizing Provider  acetaminophen (TYLENOL 8 HOUR) 650 MG CR tablet Take 1 tablet (650 mg total) by mouth every 8 (eight) hours as needed for pain. Patient not taking: Reported on 03/14/2018 01/02/18   Derwood Kaplan, MD  acidophilus (RISAQUAD) CAPS capsule Take 1 capsule by mouth daily.    [provider]  amLODipine (NORVASC) 2.5 MG tablet Take 1 tablet (2.5 mg total) by mouth daily. 03/14/18   Arthor Captain, PA-C  docusate sodium (COLACE) 100 MG capsule Take 1 capsule (100 mg total) by mouth every 12 (twelve) hours. 04/18/18   Loren Racer, MD  doxycycline (VIBRA-TABS) 100 MG tablet Take 1 tablet (100 mg total) by mouth 2 (two) times daily. Patient not taking: Reported on 02/20/2018 11/15/16   Samuel Jester, DO  Lidocaine 4 % PTCH Apply 1 patch topically 2 (two) times daily. Patient not taking: Reported on 02/20/2018 01/02/18   Derwood Kaplan, MD  meloxicam (MOBIC) 7.5 MG tablet Take 1 tablet (7.5 mg total) by mouth daily. 03/22/18   Hoy Register, MD  mirabegron ER (MYRBETRIQ) 50 MG TB24 tablet Take 50 mg by mouth daily.    [provider]  polyethylene glycol (MIRALAX / GLYCOLAX) packet Take 17 g by mouth daily. 04/18/18   Loren Racer, MD  tamsulosin (FLOMAX) 0.4 MG CAPS capsule Take 1 capsule (0.4 mg total) by mouth daily. 03/22/18   Hoy Register, MD  triamcinolone cream (KENALOG) 0.1 % Apply 1 application topically 2 (two) times daily. Patient not taking: Reported on 02/20/2018 12/16/15   Donnetta Hutching, MD    Family History Family History  Family history unknown: Yes    Social History Social History   Tobacco Use  . Smoking status: Never Smoker  . Smokeless tobacco: Never Used  Substance Use Topics  . Alcohol use: No  . Drug use: No     Allergies   Augmentin [amoxicillin-pot clavulanate]   Review of Systems Review of Systems  Constitutional: Negative for chills and fever.  Respiratory: Negative for cough and shortness of breath.   Cardiovascular: Negative for chest pain, palpitations and leg swelling.  Gastrointestinal: Positive for constipation. Negative for abdominal distention, abdominal pain, diarrhea, nausea and vomiting.  Genitourinary: Negative for difficulty urinating, flank pain and frequency.  Musculoskeletal: Negative for back pain, neck pain and neck stiffness.  Skin: Negative for rash and wound.  Neurological: Negative for dizziness, weakness, light-headedness, numbness and headaches.  All other systems reviewed and are negative.    Physical Exam Updated  Vital Signs BP (!) 143/99 (BP Location: Left Arm) Comment: Simultaneous filing. User may not have seen previous data.  Pulse (!) 32 Comment: Simultaneous filing. User may not have seen previous data.  Temp 98.4 F (36.9 C) (Oral)   Resp 19 Comment: Simultaneous filing. User may not have seen previous data.  SpO2 97% Comment: Simultaneous filing. User may not have seen previous data.  Physical Exam  Constitutional: He is  oriented to person, place, and time. He appears well-developed and well-nourished. No distress.  HENT:  Head: Normocephalic and atraumatic.  Mouth/Throat: Oropharynx is clear and moist.  Eyes: Pupils are equal, round, and reactive to light. EOM are normal.  Neck: Normal range of motion. Neck supple. No JVD present.  Cardiovascular: Normal rate.  Irregular  Pulmonary/Chest: Effort normal and breath sounds normal.  Abdominal: Soft. Bowel sounds are normal. There is no tenderness. There is no rebound and no guarding.  Musculoskeletal: Normal range of motion. He exhibits no edema or tenderness.  Lymphadenopathy:    He has no cervical adenopathy.  Neurological: He is alert and oriented to person, place, and time.  Skin: Skin is warm and dry. No rash noted. He is not diaphoretic. No erythema.  Psychiatric: He has a normal mood and affect. His behavior is normal.  Nursing note and vitals reviewed.    ED Treatments / Results  Labs (all labs ordered are listed, but only abnormal results are displayed) Labs Reviewed  COMPREHENSIVE METABOLIC PANEL - Abnormal; Notable for the following components:      Result Value   Potassium 3.2 (*)    Chloride 100 (*)    Glucose, Bld 125 (*)    Creatinine, Ser 1.39 (*)    ALT 16 (*)    GFR calc non Af Amer 46 (*)    GFR calc Af Amer 54 (*)    All other components within normal limits  LIPASE, BLOOD  CBC  URINALYSIS, ROUTINE W REFLEX MICROSCOPIC    EKG None  Radiology Dg Abd Acute W/chest  Result Date: 04/18/2018 CLINICAL DATA:  Constipation. EXAM: DG ABDOMEN ACUTE W/ 1V CHEST COMPARISON:  Abdominal radiograph February 20, 2018 FINDINGS: Cardiac silhouette is mildly enlarged. Calcified aortic arch. Lungs are clear, no pleural effusions. Stable LEFT midlung zone granuloma. No pneumothorax. Soft tissue planes and included osseous structures are nonacute. Nipple shadows projecting in bilateral lung bases. Bowel gas pattern is nondilated and  nonobstructive. Moderate volume stool projecting at the rectum. Small volume stool throughout the remaining colon. No intra-abdominal mass effect, pathologic calcifications or free air. Phleboliths project in the pelvis. Soft tissue planes and included osseous structures are non-suspicious. IMPRESSION: Mild cardiomegaly.  No acute pulmonary process. Moderate amount of stool projecting in rectum with small volume remaining large bowel stool. No bowel obstruction. Aortic Atherosclerosis (ICD10-I70.0). Electronically Signed   By: Awilda Metro M.D.   On: 04/18/2018 19:50    Procedures Procedures (including critical care time)  Medications Ordered in ED Medications  potassium chloride SA (K-DUR,KLOR-CON) CR tablet 40 mEq (40 mEq Oral Given 04/18/18 2041)     Initial Impression / Assessment and Plan / ED Course  I have reviewed the triage vital signs and the nursing notes.  Pertinent labs & imaging results that were available during my care of the patient were reviewed by me and considered in my medical decision making (see chart for details).     Mild hyperkalemia.  Evidence of constipation x-ray.  Mild elevation in creatinine which may be related  to dehydration.  Will start MiraLAX and advise follow-up with primary physician.  Return precautions given.  Final Clinical Impressions(s) / ED Diagnoses   Final diagnoses:  Constipation, unspecified constipation type  Hypokalemia    ED Discharge Orders        Ordered    polyethylene glycol (MIRALAX / GLYCOLAX) packet  Daily     04/18/18 2024    docusate sodium (COLACE) 100 MG capsule  Every 12 hours     04/18/18 2024       Loren Racer, MD 04/18/18 2213

## 2018-04-18 NOTE — ED Triage Notes (Signed)
Patient here with c/o constipation. Patient state he is been trying hard to have bowel movement and been having difficulties. Pt state he move his bowel yesterday but little bite. Pt state he been since before for the same thing and given medication.

## 2018-04-18 NOTE — ED Notes (Signed)
Pt sts he is unable to give a urine sample at this time 

## 2018-08-14 ENCOUNTER — Encounter: Payer: Self-pay | Admitting: Family Medicine

## 2018-08-14 ENCOUNTER — Ambulatory Visit: Payer: Medicare Other | Attending: Family Medicine | Admitting: Family Medicine

## 2018-08-14 VITALS — BP 128/72 | HR 72 | Temp 98.0°F | Resp 17 | Ht 64.0 in | Wt 191.4 lb

## 2018-08-14 DIAGNOSIS — M25561 Pain in right knee: Secondary | ICD-10-CM | POA: Diagnosis not present

## 2018-08-14 DIAGNOSIS — M1991 Primary osteoarthritis, unspecified site: Secondary | ICD-10-CM | POA: Diagnosis not present

## 2018-08-14 DIAGNOSIS — M170 Bilateral primary osteoarthritis of knee: Secondary | ICD-10-CM

## 2018-08-14 DIAGNOSIS — R739 Hyperglycemia, unspecified: Secondary | ICD-10-CM

## 2018-08-14 DIAGNOSIS — M25562 Pain in left knee: Secondary | ICD-10-CM | POA: Diagnosis not present

## 2018-08-14 DIAGNOSIS — M79641 Pain in right hand: Secondary | ICD-10-CM | POA: Diagnosis not present

## 2018-08-14 DIAGNOSIS — Z880 Allergy status to penicillin: Secondary | ICD-10-CM | POA: Diagnosis not present

## 2018-08-14 DIAGNOSIS — M79642 Pain in left hand: Secondary | ICD-10-CM | POA: Diagnosis not present

## 2018-08-14 DIAGNOSIS — N289 Disorder of kidney and ureter, unspecified: Secondary | ICD-10-CM

## 2018-08-14 DIAGNOSIS — M25531 Pain in right wrist: Secondary | ICD-10-CM | POA: Diagnosis not present

## 2018-08-14 DIAGNOSIS — M2550 Pain in unspecified joint: Secondary | ICD-10-CM | POA: Diagnosis not present

## 2018-08-14 DIAGNOSIS — M25532 Pain in left wrist: Secondary | ICD-10-CM | POA: Diagnosis not present

## 2018-08-14 MED ORDER — MELOXICAM 7.5 MG PO TABS: 7.5000 mg | ORAL_TABLET | Freq: Every day | ORAL | 3 refills | Status: DC

## 2018-08-14 NOTE — Progress Notes (Signed)
Subjective:    Patient ID: Thomas Rich, male    DOB: [birth year], 82 y.o.   MRN: [id number]  HPI 83o-year-old male who presents with continued bilateral knee pain, right greater than left as well as multiple joint pain including both hands, both wrist and occasional right elbow.  Patient states that he is out of the medication that he takes for pain.  Patient states that when he is sitting still, he is usually not in any pain but when he attempts to stand up, he has onset of sharp pain in his knees and pain continues with walking.  Pain is about an 8 on a 0-10 pain scale.  Patient also has recurrent swelling of his right knee.  Patient also tends to get swelling in his hands.  Patient denies any abdominal pain or nausea with the use of the prescribed pain medication, Mobic/meloxicam.  Patient states that the use of this medication does decrease his pain.  Patient has had prior x-ray done of his right knee.  Patient does not recall if he has been tested or any forms of arthritis and patient does not recall any significant family history of arthritis.  Patient moved away from his home land of [place] to attend college in [place] and does not know a lot of his parents and grandparents medical history. Patient has had no prior surgeries. Patient has never smoked.      Patient denies any issues with recent chest pain, patient was in a below the emergency department secondary to atypical chest pain.  Patient denies any shortness of breath or cough.  Patient has had no recent fever or chills.  She denies nausea/vomiting or diarrhea.  He occasionally has some constipation.  He denies any blood in the stool or dark stools.  Patient denies increased thirst. Past Medical History:  Diagnosis Date  . Arthritis    Family History  Family history unknown: Yes   Social History   Tobacco Use  . Smoking status: Never Smoker  . Smokeless tobacco: Never Used  Substance Use Topics  . Alcohol use: No  . Drug  use: No   Allergies  Allergen Reactions  . Augmentin [Amoxicillin-Pot Clavulanate] Rash    Has patient had a PCN reaction causing immediate rash, facial/tongue/throat swelling, SOB or lightheadedness with hypotension: unknown Has patient had a PCN reaction causing severe rash involving mucus membranes or skin necrosis: unknown Has patient had a PCN reaction that required hospitalization unknown Has patient had a PCN reaction occurring within the last 10 years: unknown If all of the above answers are "NO", then may proceed with Cephalosporin use.         Review of Systems     Objective:   Physical Exam BP 128/72   Pulse 72   Temp 98 F (36.7 C) (Oral)   Resp 17   Ht 5\' 4"  (1.626 m)   Wt 191 lb 6.4 oz (86.8 kg)   SpO2 99%   BMI 32.85 kg/m Vital signs and nurse's notes reviewed General-well-nourished, well-developed older male in no acute distress.  Patient is sitting in a chair in the exam room and has a crutch leaned against a nearby wall Lungs-clear to auscultation bilaterally Cardiovascular-regular rate and rhythm Abdomen-soft, nontender Musculoskeletal- patient with an effusion over the mid to lower right knee as well as bilateral joint line tenderness of both knees.  Patient with edema of the hands bilaterally right greater than left as well as edema and tenderness to palpation over  the MCP joints and tender over the right radial wrist to palpation.     Assessment & Plan:  1. Multiple joint pain Patient with multiple joint pain in the hands, wrist and knees.  Patient has had x-ray showing extensive osteophytosis and moderate to large suprapatellar effusion of the right knee.  Based on hand exam, I am suspicious that patient may have an inflammatory or autoimmune arthritis such as rheumatoid arthritis.  Patient will have arthritis panel at today's visit.  Patient will be referred to orthopedic evaluation treatment of bilateral knee pain/joint pain - Arthritis Panel -  Ambulatory referral to Orthopedic Surgery  2. Primary osteoarthritis of both knees Patient is provided with a refill of Mobic that he has been taking for joint pain. Patient should take this medication after eating. Patient has had CBC done in May of 2019 showing no anemia but patient with CMP that showed renal insufficiency/increased CR at 1.39. Will repeat BMP and may need to change pain medication based on the results.  3. Renal insufficiency Patient with CMP on 04/18/18 with Cr of 1.39 and patient has had long term use of mobic and other NSAID's for his joint pain. Will repeat BMP at today's visit to check Cr but patient also with low K of 3.2 on same CMP requiring follow-up - Basic Metabolic Panel  4. Elevated blood sugar Patient with random glucose of 125 on CMP done 04/18/18 and will obtain Hgb A1c and BMP in follow-up - Basic Metabolic Panel - Hemoglobin A1c  *Patient was offered but declined influenza immunization at today's visit  An After Visit Summary was printed and given to the patient.  Return in about 6 weeks (around 09/25/2018) for OA/review labs with PCP.

## 2018-08-15 LAB — BASIC METABOLIC PANEL WITH GFR
BUN/Creatinine Ratio: 8 — ABNORMAL LOW (ref 10–24)
BUN: 9 mg/dL (ref 8–27)
CO2: 27 mmol/L (ref 20–29)
Calcium: 9 mg/dL (ref 8.6–10.2)
Chloride: 101 mmol/L (ref 96–106)
Creatinine, Ser: 1.1 mg/dL (ref 0.76–1.27)
GFR calc Af Amer: 73 mL/min/1.73
GFR calc non Af Amer: 63 mL/min/1.73
Glucose: 72 mg/dL (ref 65–99)
Potassium: 3.8 mmol/L (ref 3.5–5.2)
Sodium: 142 mmol/L (ref 134–144)

## 2018-08-15 LAB — HEMOGLOBIN A1C
Est. average glucose Bld gHb Est-mCnc: 128 mg/dL
Hgb A1c MFr Bld: 6.1 % — ABNORMAL HIGH (ref 4.8–5.6)

## 2018-08-15 LAB — ARTHRITIS PANEL
Anti Nuclear Antibody(ANA): POSITIVE — AB
Rheumatoid fact SerPl-aCnc: <10 [IU]/mL (ref 0.0–13.9)
Sed Rate: 2 mm/h (ref 0–30)
Uric Acid: 6.8 mg/dL (ref 3.7–8.6)

## 2018-08-16 ENCOUNTER — Emergency Department (HOSPITAL_COMMUNITY)
Admission: EM | Admit: 2018-08-16 | Discharge: 2018-08-16 | Disposition: A | Discharge status: Home / Self Care | Payer: Medicare Other | Attending: Emergency Medicine | Admitting: Emergency Medicine

## 2018-08-16 ENCOUNTER — Encounter (HOSPITAL_COMMUNITY): Payer: Self-pay | Admitting: Emergency Medicine

## 2018-08-16 ENCOUNTER — Other Ambulatory Visit: Payer: Self-pay

## 2018-08-16 ENCOUNTER — Emergency Department (HOSPITAL_COMMUNITY): Payer: Medicare Other

## 2018-08-16 DIAGNOSIS — M25561 Pain in right knee: Secondary | ICD-10-CM | POA: Insufficient documentation

## 2018-08-16 DIAGNOSIS — M25461 Effusion, right knee: Secondary | ICD-10-CM | POA: Diagnosis not present

## 2018-08-16 DIAGNOSIS — S299XXA Unspecified injury of thorax, initial encounter: Secondary | ICD-10-CM | POA: Diagnosis not present

## 2018-08-16 DIAGNOSIS — M25562 Pain in left knee: Secondary | ICD-10-CM | POA: Insufficient documentation

## 2018-08-16 DIAGNOSIS — M549 Dorsalgia, unspecified: Secondary | ICD-10-CM | POA: Insufficient documentation

## 2018-08-16 DIAGNOSIS — S3992XA Unspecified injury of lower back, initial encounter: Secondary | ICD-10-CM | POA: Diagnosis not present

## 2018-08-16 DIAGNOSIS — R296 Repeated falls: Secondary | ICD-10-CM | POA: Diagnosis not present

## 2018-08-16 DIAGNOSIS — W19XXXA Unspecified fall, initial encounter: Secondary | ICD-10-CM | POA: Diagnosis not present

## 2018-08-16 DIAGNOSIS — Z79899 Other long term (current) drug therapy: Secondary | ICD-10-CM | POA: Diagnosis not present

## 2018-08-16 DIAGNOSIS — M542 Cervicalgia: Secondary | ICD-10-CM | POA: Insufficient documentation

## 2018-08-16 DIAGNOSIS — M545 Low back pain: Secondary | ICD-10-CM | POA: Diagnosis not present

## 2018-08-16 DIAGNOSIS — M1990 Unspecified osteoarthritis, unspecified site: Secondary | ICD-10-CM | POA: Insufficient documentation

## 2018-08-16 DIAGNOSIS — S8992XA Unspecified injury of left lower leg, initial encounter: Secondary | ICD-10-CM | POA: Diagnosis not present

## 2018-08-16 MED ORDER — ACETAMINOPHEN 500 MG PO TABS
1000.0000 mg | ORAL_TABLET | Freq: Once | ORAL | Status: AC
  Administered 2018-08-16: 1000 mg via ORAL
  Filled 2018-08-16: qty 2

## 2018-08-16 MED ORDER — KETOROLAC TROMETHAMINE 60 MG/2ML IM SOLN
30.0000 mg | Freq: Once | INTRAMUSCULAR | Status: AC
  Administered 2018-08-16: 30 mg via INTRAMUSCULAR
  Filled 2018-08-16: qty 2

## 2018-08-16 MED ORDER — KETOROLAC TROMETHAMINE 15 MG/ML IJ SOLN: 15.0000 mg | Freq: Once | INTRAMUSCULAR | Status: DC

## 2018-08-16 NOTE — Discharge Instructions (Addendum)
Continue your meloxicam as prescribed, take tylenol up to 1000mg  every 6 hours for pain as needed.

## 2018-08-16 NOTE — ED Provider Notes (Signed)
[facility]-EMERGENCY DEPT Provider Note   CSN: [id number] Arrival date & time: 08/16/18  7829     History   Chief Complaint Chief Complaint  Patient presents with  . Fall    HPI Thomas Rich is a 82 y.o. male.  HPI   Presents with concern for bilateral knee pain, back pain, neck pain. Pt seen yesterday with same symptoms, had labs drawn and was referred to Orthopedics. He has been provided a walker but uses it sometimes.  Had a fall yesterday, not sure if pain worse after fall specifically but notes severe pain in knees making it difficult to stand. No LOC, no headache, not on anticoagulation.    Past Medical History:  Diagnosis Date  . Arthritis     Patient Active Problem List   Diagnosis Date Noted  . BPH (benign prostatic hyperplasia) 04/26/2012  . Dizziness - light-headed 04/26/2012  . Sinusitis 04/26/2012  . UTI (lower urinary tract infection) 04/26/2012  . Hypotension 04/26/2012  . Back pain 04/26/2012  . Acute kidney injury (HCC) 04/26/2012  . Hypokalemia 04/26/2012    History reviewed. No pertinent surgical history.      Home Medications    Prior to Admission medications   Medication Sig Start Date End Date Taking? Authorizing Provider  acidophilus (RISAQUAD) CAPS capsule Take 1 capsule by mouth daily.    [provider]  amLODipine (NORVASC) 2.5 MG tablet Take 1 tablet (2.5 mg total) by mouth daily. Patient not taking: Reported on 08/14/2018 03/14/18   Arthor Captain, PA-C  docusate sodium (COLACE) 100 MG capsule Take 1 capsule (100 mg total) by mouth every 12 (twelve) hours. Patient not taking: Reported on 08/14/2018 04/18/18   Loren Racer, MD  Lidocaine 4 % PTCH Apply 1 patch topically 2 (two) times daily. Patient not taking: Reported on 02/20/2018 01/02/18   Derwood Kaplan, MD  meloxicam (MOBIC) 7.5 MG tablet Take 1 tablet (7.5 mg total) by mouth daily. 08/14/18   Fulp, Cammie, MD  mirabegron ER (MYRBETRIQ) 50  MG TB24 tablet Take 50 mg by mouth daily.    [provider]  polyethylene glycol (MIRALAX / GLYCOLAX) packet Take 17 g by mouth daily. Patient not taking: Reported on 08/14/2018 04/18/18   Loren Racer, MD  tamsulosin (FLOMAX) 0.4 MG CAPS capsule Take 1 capsule (0.4 mg total) by mouth daily. Patient not taking: Reported on 08/14/2018 03/22/18   Hoy Register, MD  triamcinolone cream (KENALOG) 0.1 % Apply 1 application topically 2 (two) times daily. Patient not taking: Reported on 02/20/2018 12/16/15   Donnetta Hutching, MD    Family History Family History  Family history unknown: Yes    Social History Social History   Tobacco Use  . Smoking status: Never Smoker  . Smokeless tobacco: Never Used  Substance Use Topics  . Alcohol use: No  . Drug use: No     Allergies   Augmentin [amoxicillin-pot clavulanate]   Review of Systems Review of Systems  Constitutional: Negative for fever.  HENT: Negative for sore throat.   Eyes: Negative for visual disturbance.  Respiratory: Negative for shortness of breath.   Cardiovascular: Negative for chest pain.  Gastrointestinal: Negative for abdominal pain.  Genitourinary: Negative for difficulty urinating.  Musculoskeletal: Positive for arthralgias, back pain, gait problem and neck pain.  Skin: Negative for rash.  Neurological: Negative for syncope and headaches.     Physical Exam Updated Vital Signs BP (!) 148/91 (BP Location: Left Arm)   Pulse 72   Temp  98.5 F (36.9 C) (Oral)   Resp 16   Ht 5\' 4"  (1.626 m)   Wt 86.8 kg   SpO2 100%   BMI 32.85 kg/m   Physical Exam  Constitutional: He is oriented to person, place, and time. He appears well-developed and well-nourished. No distress.  HENT:  Head: Normocephalic and atraumatic.  Eyes: Conjunctivae and EOM are normal.  Neck: Normal range of motion.  Cardiovascular: Normal rate, regular rhythm, normal heart sounds and intact distal pulses. Exam reveals no gallop and no  friction rub.  No murmur heard. Pulmonary/Chest: Effort normal and breath sounds normal. No respiratory distress. He has no wheezes. He has no rales.  Abdominal: Soft. He exhibits no distension. There is no tenderness. There is no guarding.  Musculoskeletal: He exhibits no edema.       Right knee: Tenderness found.       Left knee: Tenderness found.       Cervical back: He exhibits tenderness and bony tenderness.       Thoracic back: He exhibits tenderness and bony tenderness.       Lumbar back: He exhibits tenderness and bony tenderness.  Neurological: He is alert and oriented to person, place, and time.  Skin: Skin is warm and dry. He is not diaphoretic.  Nursing note and vitals reviewed.    ED Treatments / Results  Labs (all labs ordered are listed, but only abnormal results are displayed) Labs Reviewed - No data to display  EKG None  Radiology Dg Thoracic Spine 2 View  Result Date: 08/16/2018 CLINICAL DATA:  Back pain after fall. EXAM: THORACIC SPINE 2 VIEWS COMPARISON:  Radiographs of February 20, 2018. FINDINGS: No fracture or spondylolisthesis is noted. Mild anterior osteophyte formation is noted in lower thoracic spine. IMPRESSION: Mild degenerative changes as described above. No acute abnormality seen in the thoracic spine. Electronically Signed   By: Lupita Raider, M.D.   On: 08/16/2018 12:35   Dg Lumbar Spine Complete  Result Date: 08/16/2018 CLINICAL DATA:  Lower back pain after fall. EXAM: LUMBAR SPINE - COMPLETE 4+ VIEW COMPARISON:  CT scan of Apr 03, 2012. FINDINGS: No fracture or spondylolisthesis is noted. Mild degenerative disc disease is noted at L2-3, L3-4 and L4-5 with anterior osteophyte formation. IMPRESSION: Mild multilevel degenerative disc disease. No acute abnormality seen in the lumbar spine. Electronically Signed   By: Lupita Raider, M.D.   On: 08/16/2018 12:37   Ct Cervical Spine Wo Contrast  Result Date: 08/16/2018 CLINICAL DATA:  Neck and back pain.   Frequent falls. EXAM: CT CERVICAL SPINE WITHOUT CONTRAST TECHNIQUE: Multidetector CT imaging of the cervical spine was performed without intravenous contrast. Multiplanar CT image reconstructions were also generated. COMPARISON:  Radiographs 04/26/2012. FINDINGS: Alignment: Straightening without focal angulation or significant listhesis. Minimal degenerative anterolisthesis at C4-5, similar to prior radiographs. Skull base and vertebrae: No evidence of acute fracture or traumatic subluxation. There is multilevel spondylosis with facet hypertrophy. The right facet joint at C4-5 appears ankylosed. Soft tissues and spinal canal: No prevertebral fluid or swelling. No visible canal hematoma. Disc levels: There is multilevel spondylosis with disc space narrowing, uncinate spurring and facet hypertrophy. There is resulting mild-to-moderate foraminal narrowing bilaterally from C3-4 through C6-7. Upper chest: Unremarkable. Other: None. IMPRESSION: 1. No evidence of acute cervical spine fracture, traumatic subluxation or static signs of instability. 2. Multilevel spondylosis contributing to chronic multilevel osseous foraminal narrowing. Electronically Signed   By: Carey Bullocks M.D.   On: 08/16/2018 12:41  Dg Knee Complete 4 Views Left  Result Date: 08/16/2018 CLINICAL DATA:  Left knee pain after fall. EXAM: LEFT KNEE - COMPLETE 4+ VIEW COMPARISON:  None. FINDINGS: No evidence of fracture, dislocation, or joint effusion. No significant joint space narrowing is noted. Osteophyte formation is noted medially and laterally and involving the superior portion of the patella. Loose body is noted posteriorly in the joint space. Soft tissues are unremarkable. IMPRESSION: Mild degenerative changes as described above. No acute abnormality seen in the left knee. Electronically Signed   By: Lupita Raider, M.D.   On: 08/16/2018 12:31   Dg Knee Complete 4 Views Right  Result Date: 08/16/2018 CLINICAL DATA:  Right knee pain  after fall. EXAM: RIGHT KNEE - COMPLETE 4+ VIEW COMPARISON:  Radiographs of January 02, 2018. FINDINGS: No fracture or dislocation is noted. Mild patellar spurring is noted. Moderate size suprapatellar joint effusion is noted. Mild narrowing of medial joint space is noted with osteophyte formation. IMPRESSION: Moderate suprapatellar joint effusion. Mild degenerative joint disease. No fracture or dislocation is noted. Electronically Signed   By: Lupita Raider, M.D.   On: 08/16/2018 12:33    Procedures Procedures (including critical care time)  Medications Ordered in ED Medications  acetaminophen (TYLENOL) tablet 1,000 mg (1,000 mg Oral Given 08/16/18 1252)  ketorolac (TORADOL) injection 30 mg (30 mg Intramuscular Given 08/16/18 1252)     Initial Impression / Assessment and Plan / ED Course  I have reviewed the triage vital signs and the nursing notes.  Pertinent labs & imaging results that were available during my care of the patient were reviewed by me and considered in my medical decision making (see chart for details).     82yo male presents with concern for neck pain, back pain, knee pain and fall yesterday.  Given fall, CT cervical spine, XR T/L and knees without fx.  Suspect continuing pain secondary to arthritis. Pt saw PCP regarding this 2 days ago, had appropriate tests ordered to evaluate for ?RA and was referred to orthopedics. Renal function normal on recent check. Recommend continuing meloxicam, using walker.  Patient discharged in stable condition with understanding of reasons to return.   Final Clinical Impressions(s) / ED Diagnoses   Final diagnoses:  Fall, initial encounter  Arthritis    ED Discharge Orders    None       Alvira Monday, MD 08/16/18 2156

## 2018-08-16 NOTE — ED Notes (Signed)
ED Provider at bedside. SCHLOSSMAN 

## 2018-08-16 NOTE — ED Notes (Signed)
ED Provider at bedside. 

## 2018-08-16 NOTE — ED Triage Notes (Signed)
Patient here from home with complaints of neck pain, back pain, and bilateral knee pain. Reports that knees give out and he falls often.

## 2018-08-16 NOTE — ED Notes (Signed)
Patient transported to CT 

## 2018-08-28 ENCOUNTER — Emergency Department (HOSPITAL_COMMUNITY)
Admission: EM | Admit: 2018-08-28 | Discharge: 2018-08-28 | Disposition: A | Discharge status: Home / Self Care | Payer: Medicare Other | Attending: Emergency Medicine | Admitting: Emergency Medicine

## 2018-08-28 ENCOUNTER — Encounter (HOSPITAL_COMMUNITY): Payer: Self-pay | Admitting: Emergency Medicine

## 2018-08-28 DIAGNOSIS — Z79899 Other long term (current) drug therapy: Secondary | ICD-10-CM | POA: Diagnosis not present

## 2018-08-28 DIAGNOSIS — R413 Other amnesia: Secondary | ICD-10-CM | POA: Diagnosis not present

## 2018-08-28 NOTE — ED Provider Notes (Signed)
[facility]-EMERGENCY DEPT Provider Note   CSN: [id number] Arrival date & time: 08/28/18  1048     History   Chief Complaint Chief Complaint  Patient presents with  . Memory Loss    HPI Thomas Rich is a 82 y.o. male.  HPI Patient is an 82 year old male who presents to the emergency department with complaints of ongoing memory loss for years.  He is concerned about the stability of his memory.  He came to the ER at the recommendation of his family.  He has no focus complaints at this time.  He denies any new weakness.  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  His family is in [place].  He is considering moving out to be with them.   Past Medical History:  Diagnosis Date  . Arthritis     Patient Active Problem List   Diagnosis Date Noted  . BPH (benign prostatic hyperplasia) 04/26/2012  . Dizziness - light-headed 04/26/2012  . Sinusitis 04/26/2012  . UTI (lower urinary tract infection) 04/26/2012  . Hypotension 04/26/2012  . Back pain 04/26/2012  . Acute kidney injury (HCC) 04/26/2012  . Hypokalemia 04/26/2012    History reviewed. No pertinent surgical history.      Home Medications    Prior to Admission medications   Medication Sig Start Date End Date Taking? Authorizing Provider  acidophilus (RISAQUAD) CAPS capsule Take 1 capsule by mouth daily.    [provider]  amLODipine (NORVASC) 2.5 MG tablet Take 1 tablet (2.5 mg total) by mouth daily. Patient not taking: Reported on 08/14/2018 03/14/18   Arthor Captain, PA-C  docusate sodium (COLACE) 100 MG capsule Take 1 capsule (100 mg total) by mouth every 12 (twelve) hours. Patient not taking: Reported on 08/14/2018 04/18/18   Loren Racer, MD  Lidocaine 4 % PTCH Apply 1 patch topically 2 (two) times daily. Patient not taking: Reported on 02/20/2018 01/02/18   Derwood Kaplan, MD  meloxicam (MOBIC) 7.5 MG tablet Take 1 tablet (7.5 mg total) by  mouth daily. 08/14/18   Fulp, Cammie, MD  mirabegron ER (MYRBETRIQ) 50 MG TB24 tablet Take 50 mg by mouth daily.    [provider]  polyethylene glycol (MIRALAX / GLYCOLAX) packet Take 17 g by mouth daily. Patient not taking: Reported on 08/14/2018 04/18/18   Loren Racer, MD  tamsulosin (FLOMAX) 0.4 MG CAPS capsule Take 1 capsule (0.4 mg total) by mouth daily. Patient not taking: Reported on 08/14/2018 03/22/18   Hoy Register, MD  triamcinolone cream (KENALOG) 0.1 % Apply 1 application topically 2 (two) times daily. Patient not taking: Reported on 02/20/2018 12/16/15   Donnetta Hutching, MD    Family History Family History  Family history unknown: Yes    Social History Social History   Tobacco Use  . Smoking status: Never Smoker  . Smokeless tobacco: Never Used  Substance Use Topics  . Alcohol use: No  . Drug use: No     Allergies   Augmentin [amoxicillin-pot clavulanate]   Review of Systems Review of Systems  All other systems reviewed and are negative.    Physical Exam Updated Vital Signs BP (!) 158/98 (BP Location: Left Arm)   Pulse 79   Temp 98.4 F (36.9 C) (Oral)   Resp 18   Physical Exam  Constitutional: He is oriented to person, place, and time. He appears well-developed and well-nourished.  HENT:  Head: Normocephalic and atraumatic.  Eyes: EOM are normal.  Neck:  Normal range of motion.  Cardiovascular: Normal rate, regular rhythm, normal heart sounds and intact distal pulses.  Pulmonary/Chest: Effort normal and breath sounds normal. No respiratory distress.  Abdominal: Soft. He exhibits no distension. There is no tenderness.  Musculoskeletal: Normal range of motion.  Neurological: He is alert and oriented to person, place, and time.  Skin: Skin is warm and dry.  Psychiatric: He has a normal mood and affect. Judgment normal.  Nursing note and vitals reviewed.    ED Treatments / Results  Labs (all labs ordered are listed, but only abnormal  results are displayed) Labs Reviewed - No data to display  EKG None  Radiology No results found.  Procedures Procedures (including critical care time)  Medications Ordered in ED Medications - No data to display   Initial Impression / Assessment and Plan / ED Course  I have reviewed the triage vital signs and the nursing notes.  Pertinent labs & imaging results that were available during my care of the patient were reviewed by me and considered in my medical decision making (see chart for details).     No indication for work-up here in the emergency department.  Patient is given information for [facility] neurology for outpatient work-up regarding memory loss.  Final Clinical Impressions(s) / ED Diagnoses   Final diagnoses:  Memory loss    ED Discharge Orders    None       Azalia Bilis, MD 08/28/18 1114

## 2018-08-28 NOTE — ED Triage Notes (Signed)
Pt reports that he still having neck, back and knee pains that he was seen here for recently. Reports that he having memory problems that he has been seen here before but just getting worse. Reports that [place] and [place] him.

## 2018-09-20 ENCOUNTER — Emergency Department (HOSPITAL_COMMUNITY): Payer: Medicare Other

## 2018-09-20 ENCOUNTER — Encounter (HOSPITAL_COMMUNITY): Payer: Self-pay

## 2018-09-20 ENCOUNTER — Other Ambulatory Visit: Payer: Self-pay

## 2018-09-20 ENCOUNTER — Emergency Department (HOSPITAL_COMMUNITY)
Admission: EM | Admit: 2018-09-20 | Discharge: 2018-09-20 | Disposition: A | Discharge status: Home / Self Care | Payer: Medicare Other | Attending: Emergency Medicine | Admitting: Emergency Medicine

## 2018-09-20 DIAGNOSIS — J9811 Atelectasis: Secondary | ICD-10-CM | POA: Diagnosis not present

## 2018-09-20 DIAGNOSIS — W19XXXA Unspecified fall, initial encounter: Secondary | ICD-10-CM | POA: Insufficient documentation

## 2018-09-20 DIAGNOSIS — Y929 Unspecified place or not applicable: Secondary | ICD-10-CM | POA: Insufficient documentation

## 2018-09-20 DIAGNOSIS — M25562 Pain in left knee: Secondary | ICD-10-CM | POA: Insufficient documentation

## 2018-09-20 DIAGNOSIS — R079 Chest pain, unspecified: Secondary | ICD-10-CM | POA: Insufficient documentation

## 2018-09-20 DIAGNOSIS — S8992XA Unspecified injury of left lower leg, initial encounter: Secondary | ICD-10-CM | POA: Diagnosis not present

## 2018-09-20 DIAGNOSIS — Y999 Unspecified external cause status: Secondary | ICD-10-CM | POA: Diagnosis not present

## 2018-09-20 DIAGNOSIS — R0789 Other chest pain: Secondary | ICD-10-CM | POA: Diagnosis not present

## 2018-09-20 DIAGNOSIS — Y939 Activity, unspecified: Secondary | ICD-10-CM | POA: Diagnosis not present

## 2018-09-20 LAB — BASIC METABOLIC PANEL
ANION GAP: 9 (ref 5–15)
BUN: 15 mg/dL (ref 8–23)
CALCIUM: 8.9 mg/dL (ref 8.9–10.3)
CO2: 26 mmol/L (ref 22–32)
CREATININE: 1.06 mg/dL (ref 0.61–1.24)
Chloride: 105 mmol/L (ref 98–111)
Glucose, Bld: 112 mg/dL — ABNORMAL HIGH (ref 70–99)
Potassium: 3.1 mmol/L — ABNORMAL LOW (ref 3.5–5.1)
SODIUM: 140 mmol/L (ref 135–145)

## 2018-09-20 LAB — CBC
HCT: 43.6 % (ref 39.0–52.0)
Hemoglobin: 14.2 g/dL (ref 13.0–17.0)
MCH: 31 pg (ref 26.0–34.0)
MCHC: 32.6 g/dL (ref 30.0–36.0)
MCV: 95.2 fL (ref 80.0–100.0)
NRBC: 0 % (ref 0.0–0.2)
PLATELETS: 226 10*3/uL (ref 150–400)
RBC: 4.58 MIL/uL (ref 4.22–5.81)
RDW: 13.5 % (ref 11.5–15.5)
WBC: 4.7 10*3/uL (ref 4.0–10.5)

## 2018-09-20 LAB — I-STAT TROPONIN, ED: TROPONIN I, POC: 0.01 ng/mL (ref 0.00–0.08)

## 2018-09-20 LAB — TROPONIN I

## 2018-09-20 MED ORDER — AMLODIPINE BESYLATE 2.5 MG PO TABS: 2.5000 mg | ORAL_TABLET | Freq: Every day | ORAL | 0 refills | Status: DC

## 2018-09-20 MED ORDER — POTASSIUM CHLORIDE CRYS ER 20 MEQ PO TBCR
40.0000 meq | EXTENDED_RELEASE_TABLET | Freq: Once | ORAL | Status: AC
  Administered 2018-09-20: 40 meq via ORAL
  Filled 2018-09-20: qty 2

## 2018-09-20 MED ORDER — KETOROLAC TROMETHAMINE 60 MG/2ML IM SOLN
30.0000 mg | Freq: Once | INTRAMUSCULAR | Status: AC
  Administered 2018-09-20: 30 mg via INTRAMUSCULAR
  Filled 2018-09-20: qty 2

## 2018-09-20 MED ORDER — FENTANYL CITRATE (PF) 100 MCG/2ML IJ SOLN: 50.0000 ug | Freq: Once | INTRAMUSCULAR | Status: DC

## 2018-09-20 MED ORDER — POTASSIUM CHLORIDE 10 MEQ/100ML IV SOLN
10.0000 meq | Freq: Once | INTRAVENOUS | Status: AC
  Administered 2018-09-20: 10 meq via INTRAVENOUS
  Filled 2018-09-20: qty 100

## 2018-09-20 MED ORDER — LIDOCAINE 4 % EX PTCH: 1.0000 | MEDICATED_PATCH | Freq: Two times a day (BID) | CUTANEOUS | 0 refills | Status: DC

## 2018-09-20 MED ORDER — MAGNESIUM SULFATE 2 GM/50ML IV SOLN
2.0000 g | Freq: Once | INTRAVENOUS | Status: AC
  Administered 2018-09-20: 2 g via INTRAVENOUS
  Filled 2018-09-20: qty 50

## 2018-09-20 NOTE — ED Provider Notes (Signed)
Emergency Department Provider Note   I have reviewed the triage vital signs and the nursing notes.   HISTORY  Chief Complaint Chest Pain   HPI Thomas Rich is a 82 y.o. male with a history of left-sided chest pain that has recurred today.  Patient states that it is worse with a deep breath and worse with movement and palpation.  States he had this multiple times before but it never figured out what was going on.  States he did have a fall earlier today but does not think he hit his chest but is not sure.  Has not been have any shortness of breath, nausea, vomiting, diaphoresis or other abnormal symptoms.  No fevers or cough. No other associated or modifying symptoms.    Past Medical History:  Diagnosis Date  . Arthritis     Patient Active Problem List   Diagnosis Date Noted  . BPH (benign prostatic hyperplasia) 04/26/2012  . Dizziness - light-headed 04/26/2012  . Sinusitis 04/26/2012  . UTI (lower urinary tract infection) 04/26/2012  . Hypotension 04/26/2012  . Back pain 04/26/2012  . Acute kidney injury (HCC) 04/26/2012  . Hypokalemia 04/26/2012    History reviewed. No pertinent surgical history.  Current Outpatient Rx  . Order #: [id number] Class: Historical Med  . Order #: [id number] Class: Normal  . Order #: [id number] Class: Print  . Order #: [id number] Class: Print  . Order #: [id number] Class: Historical Med    Allergies Augmentin [amoxicillin-pot clavulanate]  Family History  Family history unknown: Yes    Social History Social History   Tobacco Use  . Smoking status: Never Smoker  . Smokeless tobacco: Never Used  Substance Use Topics  . Alcohol use: No  . Drug use: No    Review of Systems  All other systems negative except as documented in the HPI. All pertinent positives and negatives as reviewed in the HPI. ____________________________________________   PHYSICAL EXAM:  VITAL SIGNS: ED Triage Vitals  Enc Vitals Group     BP  09/20/18 0914 (!) 149/116     Pulse Rate 09/20/18 0914 80     Resp 09/20/18 0914 18     Temp 09/20/18 0914 98 F (36.7 C)     Temp Source 09/20/18 0914 Oral     SpO2 09/20/18 0914 97 %     Weight 09/20/18 0921 186 lb (84.4 kg)     Height --      Head Circumference --      Peak Flow --      Pain Score 09/20/18 1516 0     Pain Loc --      Pain Edu? --      Excl. in GC? --     Constitutional: Alert and oriented. Well appearing and in no acute distress. Eyes: Conjunctivae are normal. PERRL. EOMI. Head: Atraumatic. Nose: No congestion/rhinnorhea. Mouth/Throat: Mucous membranes are moist.  Oropharynx non-erythematous. Neck: No stridor.  No meningeal signs.   Cardiovascular: Normal rate, regular rhythm. Good peripheral circulation. Grossly normal heart sounds.  Respiratory: Normal respiratory effort.  No retractions. Lungs CTAB. Gastrointestinal: Soft and nontender. No distention.  Musculoskeletal: No lower extremity tenderness nor edema. No gross deformities of extremities. ttp left lateral chest. No rash. No deformity. Neurologic:  Normal speech and language. No gross focal neurologic deficits are appreciated.  Skin:  Skin is warm, dry and intact. No rash noted.  ____________________________________________   LABS (all labs ordered are listed, but only abnormal results are displayed)  Labs Reviewed  BASIC METABOLIC  PANEL - Abnormal; Notable for the following components:      Result Value   Potassium 3.1 (*)    Glucose, Bld 112 (*)    All other components within normal limits  CBC  TROPONIN I  I-STAT TROPONIN, ED   ____________________________________________  EKG   EKG Interpretation  Date/Time:  Wednesday September 20 2018 10:24:40 EDT Ventricular Rate:  71 PR Interval:    QRS Duration: 97 QT Interval:  457 QTC Calculation: 409 R Axis:   -62 Text Interpretation:  undetermined rhythm with  bradycardia ventricular rate around 55 Multiple premature complexes, vent &  supraven Inferior infarct, old Anterior infarct, old Baseline wander in lead(s) V3 No STEMI. Similar to prior.  Reconfirmed by Marily Memos [phone]) on 09/20/2018 10:36:56 AM Also confirmed by Marily Memos [phone]), editor Elita Quick [phone])  on 09/20/2018 12:24:44 PM       ____________________________________________  RADIOLOGY  Dg Chest 2 View  Result Date: 09/20/2018 CLINICAL DATA:  Left side chest pain EXAM: CHEST - 2 VIEW COMPARISON:  04/18/2018 FINDINGS: Cardiomegaly. Elevation of the right hemidiaphragm. Bibasilar atelectasis. No effusions or acute bony abnormality. IMPRESSION: Cardiomegaly. Mild elevation of the right hemidiaphragm with bibasilar atelectasis. Electronically Signed   By: Charlett Nose M.D.   On: 09/20/2018 10:09   Dg Knee Complete 4 Views Left  Result Date: 09/20/2018 CLINICAL DATA:  Anterior knee pain after falling. EXAM: LEFT KNEE - COMPLETE 4+ VIEW COMPARISON:  Radiographs 08/16/2018. FINDINGS: The mineralization and alignment are normal. There is no evidence of acute fracture or dislocation. There are moderate tricompartmental degenerative changes with osteophytes. There is a small knee joint effusion with mild subcutaneous edema anteriorly. Loose body again noted in the popliteal fossa. IMPRESSION: No acute osseous findings. Tricompartmental degenerative changes, small joint effusion and posterior loose body similar to previous study. Electronically Signed   By: Carey Bullocks M.D.   On: 09/20/2018 11:06    ____________________________________________   PROCEDURES  Procedure(s) performed:   Procedures   ____________________________________________   INITIAL IMPRESSION / ASSESSMENT AND PLAN / ED COURSE  Negative ECG's, negative troponin. Low suspicion for PE. No e/o rib fracture/pneumonia or pneumothorax on xr. Plan for symptomatic treatment at home with pcp follow up for same.   Pertinent labs & imaging results that were available during my  care of the patient were reviewed by me and considered in my medical decision making (see chart for details).  ____________________________________________  FINAL CLINICAL IMPRESSION(S) / ED DIAGNOSES  Final diagnoses:  Nonspecific chest pain    MEDICATIONS GIVEN DURING THIS VISIT:  Medications  fentaNYL (SUBLIMAZE) injection 50 mcg (50 mcg Intravenous Refused 09/20/18 1415)  ketorolac (TORADOL) injection 30 mg (30 mg Intramuscular Given 09/20/18 1027)  potassium chloride 10 mEq in 100 mL IVPB (0 mEq Intravenous Stopped 09/20/18 1409)  magnesium sulfate IVPB 2 g 50 mL (0 g Intravenous Stopped 09/20/18 1451)  potassium chloride SA (K-DUR,KLOR-CON) CR tablet 40 mEq (40 mEq Oral Given 09/20/18 1208)     NEW OUTPATIENT MEDICATIONS STARTED DURING THIS VISIT:  Discharge Medication List as of 09/20/2018  3:10 PM      Note:  This note was prepared with assistance of Dragon voice recognition software. Occasional wrong-word or sound-a-like substitutions may have occurred due to the inherent limitations of voice recognition software.   Marily Memos, MD 09/20/18 1540

## 2018-09-20 NOTE — ED Notes (Signed)
Patient transported to X-ray 

## 2018-09-20 NOTE — ED Triage Notes (Signed)
Pt reports stabbing chest pain on left chest and arm. Pt states he has been having approximately 3 BMs per day, and is concerned about that

## 2018-10-16 ENCOUNTER — Encounter (HOSPITAL_COMMUNITY): Payer: Self-pay

## 2018-10-16 ENCOUNTER — Emergency Department (HOSPITAL_COMMUNITY)
Admission: EM | Admit: 2018-10-16 | Discharge: 2018-10-16 | Disposition: A | Discharge status: Home / Self Care | Payer: Medicare Other | Attending: Emergency Medicine | Admitting: Emergency Medicine

## 2018-10-16 ENCOUNTER — Other Ambulatory Visit: Payer: Self-pay

## 2018-10-16 DIAGNOSIS — M542 Cervicalgia: Secondary | ICD-10-CM | POA: Diagnosis not present

## 2018-10-16 DIAGNOSIS — M1990 Unspecified osteoarthritis, unspecified site: Secondary | ICD-10-CM | POA: Diagnosis not present

## 2018-10-16 DIAGNOSIS — Z79899 Other long term (current) drug therapy: Secondary | ICD-10-CM | POA: Diagnosis not present

## 2018-10-16 DIAGNOSIS — M545 Low back pain: Secondary | ICD-10-CM | POA: Diagnosis not present

## 2018-10-16 MED ORDER — MELOXICAM 7.5 MG PO TABS: 7.5000 mg | ORAL_TABLET | Freq: Every day | ORAL | 0 refills | Status: DC

## 2018-10-16 NOTE — ED Provider Notes (Signed)
[facility]-EMERGENCY DEPT Provider Note   CSN: [id number] Arrival date & time: 10/16/18  1259     History   Chief Complaint Chief Complaint  Patient presents with  . Neck Pain  . Knee Pain    HPI Thomas Rich is a 82 y.o. male.  Patient is a 82 year old male who presents requesting medication for his arthritis.  He has chronic issues with his neck and back and he also has some chronic pain in his right wrist.  He says that this pain is been going on for several years.  He has been on medication in the past and request a refill on the medication.  He does not know the name of the medications.  On chart review it looks like he is been on Mobic in the past.  He denies any joint swelling.  He denies any recent falls or injuries.  He is only requesting medication for his discomfort.  He tells me that he is currently not taking any medications at home.  On chart review it looks like he is on amlodipine and some other medications but he denies taking any medications at home.  He does say that he is about to move out with his family in [place].     Past Medical History:  Diagnosis Date  . Arthritis     Patient Active Problem List   Diagnosis Date Noted  . BPH (benign prostatic hyperplasia) 04/26/2012  . Dizziness - light-headed 04/26/2012  . Sinusitis 04/26/2012  . UTI (lower urinary tract infection) 04/26/2012  . Hypotension 04/26/2012  . Back pain 04/26/2012  . Acute kidney injury (HCC) 04/26/2012  . Hypokalemia 04/26/2012    History reviewed. No pertinent surgical history.      Home Medications    Prior to Admission medications   Medication Sig Start Date End Date Taking? Authorizing Provider  acidophilus (RISAQUAD) CAPS capsule Take 1 capsule by mouth daily.    [provider]  amLODipine (NORVASC) 2.5 MG tablet Take 1 tablet (2.5 mg total) by mouth daily. 09/20/18   Mesner, Barbara CowerJason, MD  Lidocaine 4 % PTCH Apply 1 patch  topically 2 (two) times daily. 09/20/18   Mesner, Barbara CowerJason, MD  meloxicam (MOBIC) 7.5 MG tablet Take 1 tablet (7.5 mg total) by mouth daily. 10/16/18   Rolan BuccoBelfi, Isbella Arline, MD  mirabegron ER (MYRBETRIQ) 50 MG TB24 tablet Take 50 mg by mouth daily.    [provider]    Family History Family History  Family history unknown: Yes    Social History Social History   Tobacco Use  . Smoking status: Never Smoker  . Smokeless tobacco: Never Used  Substance Use Topics  . Alcohol use: No  . Drug use: No     Allergies   Augmentin [amoxicillin-pot clavulanate]   Review of Systems Review of Systems  Constitutional: Negative for chills, diaphoresis, fatigue and fever.  HENT: Negative for congestion, rhinorrhea and sneezing.   Eyes: Negative.   Respiratory: Negative for cough, chest tightness and shortness of breath.   Cardiovascular: Negative for chest pain and leg swelling.  Gastrointestinal: Negative for abdominal pain, blood in stool, diarrhea, nausea and vomiting.  Genitourinary: Negative for difficulty urinating, flank pain, frequency and hematuria.  Musculoskeletal: Positive for arthralgias, back pain and neck pain.  Skin: Negative for rash.  Neurological: Negative for dizziness, speech difficulty, weakness, numbness and headaches.     Physical Exam Updated Vital Signs BP 118/73 (BP Location: Left Arm)   Pulse 72  Temp 98.7 F (37.1 C) (Oral)   Resp 20   SpO2 96%   Physical Exam  Constitutional: He is oriented to person, place, and time. He appears well-developed and well-nourished.  HENT:  Head: Normocephalic and atraumatic.  Eyes: Pupils are equal, round, and reactive to light.  Neck: Normal range of motion. Neck supple.  Cardiovascular: Normal rate, regular rhythm and normal heart sounds.  Pulmonary/Chest: Effort normal and breath sounds normal. No respiratory distress. He has no wheezes. He has no rales. He exhibits no tenderness.  Abdominal: Soft. Bowel sounds  are normal. There is no tenderness. There is no rebound and no guarding.  Musculoskeletal: Normal range of motion. He exhibits no edema.  Patient has no specific areas of joint tenderness.  No swelling.  No warmth or erythema over the joints.  Lymphadenopathy:    He has no cervical adenopathy.  Neurological: He is alert and oriented to person, place, and time.  Skin: Skin is warm and dry. No rash noted.  Psychiatric: He has a normal mood and affect.     ED Treatments / Results  Labs (all labs ordered are listed, but only abnormal results are displayed) Labs Reviewed - No data to display  EKG None  Radiology No results found.  Procedures Procedures (including critical care time)  Medications Ordered in ED Medications - No data to display   Initial Impression / Assessment and Plan / ED Course  I have reviewed the triage vital signs and the nursing notes.  Pertinent labs & imaging results that were available during my care of the patient were reviewed by me and considered in my medical decision making (see chart for details).     Patient presents requesting some medication for his arthritis.  He was on Mobic in the past.  I do see that he was seen at [facility] and a referral was made to orthopedics but patient is not aware of this.  I will give him a refill on his Mobic.  I did not refill his other medications.  His blood pressure currently is 118/73.  I encouraged him to follow-up with his PCP at [facility].  Final Clinical Impressions(s) / ED Diagnoses   Final diagnoses:  Arthritis    ED Discharge Orders         Ordered    meloxicam (MOBIC) 7.5 MG tablet  Daily     10/16/18 1640           Rolan Bucco, MD 10/16/18 1648

## 2018-10-16 NOTE — ED Notes (Signed)
Pt states that he has 8/10 pain to his back and neck. Pt states that he has been seen here for similar pain.

## 2018-10-16 NOTE — ED Triage Notes (Signed)
Patient arrived via bus. Patient c/o neck, left arm, and lower back pain x6 months. Patient is out of pain medication. Patient ambulatory with crutch. Patient denies falls. Pain increasing and wants to be evaluated.   A/Ox4.

## 2018-10-17 ENCOUNTER — Encounter (HOSPITAL_COMMUNITY): Payer: Self-pay

## 2018-10-17 ENCOUNTER — Emergency Department (HOSPITAL_COMMUNITY): Payer: Medicare Other

## 2018-10-17 ENCOUNTER — Emergency Department (HOSPITAL_COMMUNITY)
Admission: EM | Admit: 2018-10-17 | Discharge: 2018-10-17 | Disposition: A | Discharge status: Home / Self Care | Payer: Medicare Other | Attending: Emergency Medicine | Admitting: Emergency Medicine

## 2018-10-17 DIAGNOSIS — R059 Cough, unspecified: Secondary | ICD-10-CM

## 2018-10-17 DIAGNOSIS — M7918 Myalgia, other site: Secondary | ICD-10-CM | POA: Diagnosis present

## 2018-10-17 DIAGNOSIS — Z79899 Other long term (current) drug therapy: Secondary | ICD-10-CM | POA: Diagnosis not present

## 2018-10-17 DIAGNOSIS — M1990 Unspecified osteoarthritis, unspecified site: Secondary | ICD-10-CM | POA: Insufficient documentation

## 2018-10-17 DIAGNOSIS — R05 Cough: Secondary | ICD-10-CM | POA: Diagnosis not present

## 2018-10-17 DIAGNOSIS — E876 Hypokalemia: Secondary | ICD-10-CM

## 2018-10-17 LAB — CBC WITH DIFFERENTIAL/PLATELET
Abs Immature Granulocytes: 0.02 10*3/uL (ref 0.00–0.07)
BASOS ABS: 0.1 10*3/uL (ref 0.0–0.1)
BASOS PCT: 1 %
EOS ABS: 0.5 10*3/uL (ref 0.0–0.5)
EOS PCT: 7 %
HEMATOCRIT: 41.4 % (ref 39.0–52.0)
Hemoglobin: 13.9 g/dL (ref 13.0–17.0)
Immature Granulocytes: 0 %
LYMPHS ABS: 1.6 10*3/uL (ref 0.7–4.0)
Lymphocytes Relative: 22 %
MCH: 32.4 pg (ref 26.0–34.0)
MCHC: 33.6 g/dL (ref 30.0–36.0)
MCV: 96.5 fL (ref 80.0–100.0)
Monocytes Absolute: 0.7 10*3/uL (ref 0.1–1.0)
Monocytes Relative: 10 %
NRBC: 0 % (ref 0.0–0.2)
Neutro Abs: 4.2 10*3/uL (ref 1.7–7.7)
Neutrophils Relative %: 60 %
PLATELETS: 201 10*3/uL (ref 150–400)
RBC: 4.29 MIL/uL (ref 4.22–5.81)
RDW: 13.2 % (ref 11.5–15.5)
WBC: 7 10*3/uL (ref 4.0–10.5)

## 2018-10-17 LAB — BASIC METABOLIC PANEL
Anion gap: 10 (ref 5–15)
BUN: 9 mg/dL (ref 8–23)
CALCIUM: 8.7 mg/dL — AB (ref 8.9–10.3)
CO2: 26 mmol/L (ref 22–32)
CREATININE: 1.08 mg/dL (ref 0.61–1.24)
Chloride: 104 mmol/L (ref 98–111)
Glucose, Bld: 117 mg/dL — ABNORMAL HIGH (ref 70–99)
Potassium: 2.9 mmol/L — ABNORMAL LOW (ref 3.5–5.1)
Sodium: 140 mmol/L (ref 135–145)

## 2018-10-17 MED ORDER — MELOXICAM 15 MG PO TABS
15.0000 mg | ORAL_TABLET | Freq: Once | ORAL | Status: AC
  Administered 2018-10-17: 15 mg via ORAL
  Filled 2018-10-17: qty 1

## 2018-10-17 MED ORDER — POTASSIUM CHLORIDE CRYS ER 20 MEQ PO TBCR
40.0000 meq | EXTENDED_RELEASE_TABLET | Freq: Once | ORAL | Status: AC
  Administered 2018-10-17: 40 meq via ORAL
  Filled 2018-10-17: qty 2

## 2018-10-17 MED ORDER — MELOXICAM 7.5 MG PO TABS: 7.5000 mg | ORAL_TABLET | Freq: Every day | ORAL | 0 refills | Status: DC

## 2018-10-17 MED ORDER — POTASSIUM CHLORIDE ER 10 MEQ PO TBCR: 10.0000 meq | EXTENDED_RELEASE_TABLET | Freq: Every day | ORAL | 0 refills | Status: DC

## 2018-10-17 NOTE — Progress Notes (Signed)
CSW aware of consult for additional resources. Per EDP, patient is from home and appears to be declining. EDP stated patient has an airplane [place] but has yet to use it. CSW spoke with patient at bedside who reports he currently lives alone and has a friend who comes and checks on him. Patient voices no concerns and declined any kind of additional services at home as [place]. Per patient, his family has bought him a house and they are ready for him to be close. Patient stated that once he finds out about his health he plans to pack up and move there. Patient declined needing any resources at this time. No further CSW needs at this time. Please reconsult if needs arise.  Archie BalboaMackenzie Irwin, LCSWA  Clinical Social Work Department  [facility]  [phone]

## 2018-10-17 NOTE — ED Triage Notes (Addendum)
Pt c/o pain in his knees, back, and neck. Pt is out of pain medication, was seen yesterday for same. Pt also reports he needs to see a mental health doctor and he also would like to inquire as to why he is coughing.

## 2018-10-17 NOTE — ED Notes (Signed)
Patient transported to X-ray 

## 2018-10-17 NOTE — ED Provider Notes (Signed)
[facility]-EMERGENCY DEPT Provider Note   CSN: [id number] Arrival date & time: 10/17/18  4098     History   Chief Complaint Chief Complaint  Patient presents with  . Multiple Complaints    HPI Thomas Rich is a 82 y.o. male.  Pt presents to the ED today with several issues.  He was here yesterday for arthritis pain and was given a rx for Mobic.  He did not know where to get his medication filled although it was on the d/c summary.  The pt said he's had a cough and wants to know why.  He has not had a fever.  He also said he gets angry a lot and does not have a lot of patience.  He would like to see a mental health doctor for that.  He denies si/hi.  He is originally from [place], went to [place] for college, and lives in [place] now alone.  He has 2 sons on the [address]  He said he has an open ticket to fly there to live with them, but he has not wanted to do this yet.  The pt said he's been eating ok.  He did see his primary care doctor in September, but does not remember seeing his doctor.  He said his short term memory is poor.     Past Medical History:  Diagnosis Date  . Arthritis     Patient Active Problem List   Diagnosis Date Noted  . BPH (benign prostatic hyperplasia) 04/26/2012  . Dizziness - light-headed 04/26/2012  . Sinusitis 04/26/2012  . UTI (lower urinary tract infection) 04/26/2012  . Hypotension 04/26/2012  . Back pain 04/26/2012  . Acute kidney injury (HCC) 04/26/2012  . Hypokalemia 04/26/2012    History reviewed. No pertinent surgical history.      Home Medications    Prior to Admission medications   Medication Sig Start Date End Date Taking? Authorizing Provider  amLODipine (NORVASC) 2.5 MG tablet Take 1 tablet (2.5 mg total) by mouth daily. Patient not taking: Reported on 10/17/2018 09/20/18   Mesner, Barbara Cower, MD  Lidocaine 4 % PTCH Apply 1 patch topically 2 (two) times daily. Patient not taking: Reported on  10/17/2018 09/20/18   Mesner, Barbara Cower, MD  meloxicam (MOBIC) 7.5 MG tablet Take 1 tablet (7.5 mg total) by mouth daily. 10/17/18   Jacalyn Lefevre, MD  potassium chloride (K-DUR) 10 MEQ tablet Take 1 tablet (10 mEq total) by mouth daily. 10/17/18   Jacalyn Lefevre, MD    Family History Family History  Family history unknown: Yes    Social History Social History   Tobacco Use  . Smoking status: Never Smoker  . Smokeless tobacco: Never Used  Substance Use Topics  . Alcohol use: No  . Drug use: No     Allergies   Augmentin [amoxicillin-pot clavulanate]   Review of Systems Review of Systems  Respiratory: Positive for cough.   Musculoskeletal: Positive for arthralgias and myalgias.  All other systems reviewed and are negative.    Physical Exam Updated Vital Signs BP (!) 140/102 (BP Location: Right Arm)   Pulse 60   Temp 98.2 F (36.8 C) (Oral)   Resp 17   SpO2 97%   Physical Exam  Constitutional: He is oriented to person, place, and time. He appears well-developed and well-nourished.  HENT:  Head: Normocephalic and atraumatic.  Right Ear: External ear normal.  Left Ear: External ear normal.  Nose: Nose normal.  Mouth/Throat: Oropharynx is clear and  moist.  Eyes: Pupils are equal, round, and reactive to light. Conjunctivae and EOM are normal.  Neck: Normal range of motion. Neck supple.  Cardiovascular: Normal rate, regular rhythm, normal heart sounds and intact distal pulses.  Pulmonary/Chest: Effort normal and breath sounds normal.  Abdominal: Soft. Bowel sounds are normal.  Musculoskeletal: Normal range of motion.  Arthritic joints  Neurological: He is alert and oriented to person, place, and time.  Skin: Skin is warm. Capillary refill takes less than 2 seconds.  Psychiatric: He has a normal mood and affect. His behavior is normal. Judgment and thought content normal.  Nursing note and vitals reviewed.    ED Treatments / Results  Labs (all labs ordered are  listed, but only abnormal results are displayed) Labs Reviewed  BASIC METABOLIC PANEL - Abnormal; Notable for the following components:      Result Value   Potassium 2.9 (*)    Glucose, Bld 117 (*)    Calcium 8.7 (*)    All other components within normal limits  CBC WITH DIFFERENTIAL/PLATELET    EKG None  Radiology Dg Chest 2 View  Result Date: 10/17/2018 CLINICAL DATA:  Cough. EXAM: CHEST - 2 VIEW COMPARISON:  09/20/2018. FINDINGS: Mediastinum and hilar structures normal. Cardiomegaly with normal pulmonary vascularity. Mild right base subsegmental atelectasis and or scarring. No pleural effusion or pneumothorax. IMPRESSION: Cardiomegaly with normal pulmonary vascularity. Mild right base subsegmental atelectasis and or scarring. Electronically Signed   By: Maisie Fushomas  Register   On: 10/17/2018 11:42    Procedures Procedures (including critical care time)  Medications Ordered in ED Medications  potassium chloride SA (K-DUR,KLOR-CON) CR tablet 40 mEq (has no administration in time range)  meloxicam (MOBIC) tablet 15 mg (15 mg Oral Given 10/17/18 1057)     Initial Impression / Assessment and Plan / ED Course  I have reviewed the triage vital signs and the nursing notes.  Pertinent labs & imaging results that were available during my care of the patient were reviewed by me and considered in my medical decision making (see chart for details).    Pt is feeling much better with his dose of mobic.  SW did speak with patient and he does not want any home services.  However, pt did enjoy talking with SW.  I think that is what he needed.  The pt does have hypokalemia and was given 40mEq here.  Etiology of cough is likely viral.  No fever.  No pna.  He will be d/c with paper rx that he can take to the pharmacy of choice.  He knows to return if worse.  Final Clinical Impressions(s) / ED Diagnoses   Final diagnoses:  Hypokalemia  Osteoarthritis, unspecified osteoarthritis type, unspecified  site  Cough    ED Discharge Orders         Ordered    meloxicam (MOBIC) 7.5 MG tablet  Daily     10/17/18 1343    potassium chloride (K-DUR) 10 MEQ tablet  Daily     10/17/18 1343           Jacalyn LefevreHaviland, Torrez Renfroe, MD 10/17/18 1347

## 2018-10-30 ENCOUNTER — Encounter (HOSPITAL_COMMUNITY): Payer: Self-pay

## 2018-10-30 ENCOUNTER — Other Ambulatory Visit: Payer: Self-pay

## 2018-10-30 ENCOUNTER — Emergency Department (HOSPITAL_COMMUNITY): Payer: Medicare Other

## 2018-10-30 ENCOUNTER — Emergency Department (HOSPITAL_COMMUNITY)
Admission: EM | Admit: 2018-10-30 | Discharge: 2018-10-30 | Disposition: A | Discharge status: Home / Self Care | Payer: Medicare Other | Attending: Emergency Medicine | Admitting: Emergency Medicine

## 2018-10-30 DIAGNOSIS — J209 Acute bronchitis, unspecified: Secondary | ICD-10-CM | POA: Insufficient documentation

## 2018-10-30 DIAGNOSIS — R05 Cough: Secondary | ICD-10-CM | POA: Diagnosis not present

## 2018-10-30 HISTORY — DX: Unspecified hearing loss, unspecified ear: H91.90

## 2018-10-30 MED ORDER — AZITHROMYCIN 250 MG PO TABS: ORAL_TABLET | ORAL | 0 refills | Status: DC

## 2018-10-30 NOTE — ED Triage Notes (Signed)
Patient c/o nasal congestion and a productive cough with light yellow sputum x 3 weeks.

## 2018-10-30 NOTE — Discharge Instructions (Addendum)
Zithromax as prescribed.  Over-the-counter medications as per package instructions as needed for symptomatic relief.  Return to the emergency department for chest pain, difficulty breathing, or other new and concerning symptoms.

## 2018-10-30 NOTE — ED Provider Notes (Signed)
[facility]-EMERGENCY DEPT Provider Note   CSN: [id number] Arrival date & time: 10/30/18  1048     History   Chief Complaint Chief Complaint  Patient presents with  . Nasal Congestion  . Cough    HPI Eliseo SquiresSiosaia Criado is a 82 y.o. male.  Patient is an 82 year old male with past medical history of BPH, prior UTI.  He presents today for evaluation of chest congestion and cough.  This is been ongoing for the past several days.  His cough has been productive of yellow mucus.  He denies any difficulty breathing or chest pain.  He denies any fever.  The history is provided by the patient.  Cough  This is a new problem. Episode onset: 3 days ago. The cough is productive of purulent sputum. There has been no fever. Pertinent negatives include no chest pain and no shortness of breath. He has tried nothing for the symptoms.    Past Medical History:  Diagnosis Date  . Arthritis   . HOH (hard of hearing)     Patient Active Problem List   Diagnosis Date Noted  . BPH (benign prostatic hyperplasia) 04/26/2012  . Dizziness - light-headed 04/26/2012  . Sinusitis 04/26/2012  . UTI (lower urinary tract infection) 04/26/2012  . Hypotension 04/26/2012  . Back pain 04/26/2012  . Acute kidney injury (HCC) 04/26/2012  . Hypokalemia 04/26/2012    History reviewed. No pertinent surgical history.      Home Medications    Prior to Admission medications   Medication Sig Start Date End Date Taking? Authorizing Provider  amLODipine (NORVASC) 2.5 MG tablet Take 1 tablet (2.5 mg total) by mouth daily. Patient not taking: Reported on 10/17/2018 09/20/18   Mesner, Barbara CowerJason, MD  Lidocaine 4 % PTCH Apply 1 patch topically 2 (two) times daily. Patient not taking: Reported on 10/17/2018 09/20/18   Mesner, Barbara CowerJason, MD  meloxicam (MOBIC) 7.5 MG tablet Take 1 tablet (7.5 mg total) by mouth daily. 10/17/18   Jacalyn LefevreHaviland, Julie, MD  potassium chloride (K-DUR) 10 MEQ tablet Take 1  tablet (10 mEq total) by mouth daily. 10/17/18   Jacalyn LefevreHaviland, Julie, MD    Family History Family History  Family history unknown: Yes    Social History Social History   Tobacco Use  . Smoking status: Never Smoker  . Smokeless tobacco: Never Used  Substance Use Topics  . Alcohol use: No  . Drug use: No     Allergies   Augmentin [amoxicillin-pot clavulanate]   Review of Systems Review of Systems  Respiratory: Positive for cough. Negative for shortness of breath.   Cardiovascular: Negative for chest pain.  All other systems reviewed and are negative.    Physical Exam Updated Vital Signs BP 128/67   Pulse 63   Temp 97.6 F (36.4 C) (Oral)   Resp 16   SpO2 93%   Physical Exam  Constitutional: He is oriented to person, place, and time. He appears well-developed and well-nourished. No distress.  HENT:  Head: Normocephalic and atraumatic.  Mouth/Throat: Oropharynx is clear and moist.  Neck: Normal range of motion. Neck supple.  Cardiovascular: Normal rate and regular rhythm. Exam reveals no friction rub.  No murmur heard. Pulmonary/Chest: Effort normal and breath sounds normal. No respiratory distress. He has no wheezes. He has no rales.  Abdominal: Soft. Bowel sounds are normal. He exhibits no distension. There is no tenderness.  Musculoskeletal: Normal range of motion. He exhibits no edema.  Neurological: He is alert and oriented to person,  place, and time. Coordination normal.  Skin: Skin is warm and dry. He is not diaphoretic.  Nursing note and vitals reviewed.    ED Treatments / Results  Labs (all labs ordered are listed, but only abnormal results are displayed) Labs Reviewed - No data to display  EKG None  Radiology No results found.  Procedures Procedures (including critical care time)  Medications Ordered in ED Medications - No data to display   Initial Impression / Assessment and Plan / ED Course  I have reviewed the triage vital signs and  the nursing notes.  Pertinent labs & imaging results that were available during my care of the patient were reviewed by me and considered in my medical decision making (see chart for details).  Patient presents with chest congestion and productive cough for the past week.  His chest x-ray does not show pneumonia.  Patient will be treated for bronchitis and will be discharged home.  His oxygen saturations are normal, lungs are clear, and he is in no distress.  Final Clinical Impressions(s) / ED Diagnoses   Final diagnoses:  None    ED Discharge Orders    None       Geoffery Lyons, MD 10/30/18 1332

## 2018-11-23 ENCOUNTER — Emergency Department (HOSPITAL_COMMUNITY)
Admission: EM | Admit: 2018-11-23 | Discharge: 2018-11-23 | Disposition: A | Discharge status: Home / Self Care | Payer: Medicare Other | Attending: Emergency Medicine | Admitting: Emergency Medicine

## 2018-11-23 ENCOUNTER — Other Ambulatory Visit: Payer: Self-pay

## 2018-11-23 ENCOUNTER — Emergency Department (HOSPITAL_COMMUNITY): Payer: Medicare Other

## 2018-11-23 DIAGNOSIS — Z79899 Other long term (current) drug therapy: Secondary | ICD-10-CM | POA: Insufficient documentation

## 2018-11-23 DIAGNOSIS — I10 Essential (primary) hypertension: Secondary | ICD-10-CM | POA: Diagnosis not present

## 2018-11-23 DIAGNOSIS — J069 Acute upper respiratory infection, unspecified: Secondary | ICD-10-CM | POA: Insufficient documentation

## 2018-11-23 DIAGNOSIS — R52 Pain, unspecified: Secondary | ICD-10-CM | POA: Diagnosis not present

## 2018-11-23 DIAGNOSIS — R509 Fever, unspecified: Secondary | ICD-10-CM

## 2018-11-23 DIAGNOSIS — R51 Headache: Secondary | ICD-10-CM | POA: Diagnosis not present

## 2018-11-23 DIAGNOSIS — R05 Cough: Secondary | ICD-10-CM | POA: Diagnosis not present

## 2018-11-23 DIAGNOSIS — J986 Disorders of diaphragm: Secondary | ICD-10-CM | POA: Diagnosis not present

## 2018-11-23 DIAGNOSIS — M542 Cervicalgia: Secondary | ICD-10-CM | POA: Diagnosis not present

## 2018-11-23 LAB — I-STAT CG4 LACTIC ACID, ED: Lactic Acid, Venous: 2.22 mmol/L (ref 0.5–1.9)

## 2018-11-23 LAB — COMPREHENSIVE METABOLIC PANEL
ALT: 11 U/L (ref 0–44)
AST: 28 U/L (ref 15–41)
Albumin: 3.9 g/dL (ref 3.5–5.0)
Alkaline Phosphatase: 50 U/L (ref 38–126)
Anion gap: 11 (ref 5–15)
BUN: 11 mg/dL (ref 8–23)
CO2: 28 mmol/L (ref 22–32)
Calcium: 8.6 mg/dL — ABNORMAL LOW (ref 8.9–10.3)
Chloride: 100 mmol/L (ref 98–111)
Creatinine, Ser: 1.3 mg/dL — ABNORMAL HIGH (ref 0.61–1.24)
GFR calc Af Amer: 60 mL/min — ABNORMAL LOW (ref >60)
GFR calc non Af Amer: 52 mL/min — ABNORMAL LOW (ref >60)
Glucose, Bld: 149 mg/dL — ABNORMAL HIGH (ref 70–99)
POTASSIUM: 3.2 mmol/L — AB (ref 3.5–5.1)
Sodium: 139 mmol/L (ref 135–145)
Total Bilirubin: 1 mg/dL (ref 0.3–1.2)
Total Protein: 7.2 g/dL (ref 6.5–8.1)

## 2018-11-23 LAB — URINALYSIS, ROUTINE W REFLEX MICROSCOPIC
BILIRUBIN URINE: NEGATIVE
Glucose, UA: NEGATIVE mg/dL
HGB URINE DIPSTICK: NEGATIVE
Ketones, ur: NEGATIVE mg/dL
Leukocytes, UA: NEGATIVE
Nitrite: NEGATIVE
Protein, ur: NEGATIVE mg/dL
Specific Gravity, Urine: 1.015 (ref 1.005–1.030)
pH: 7 (ref 5.0–8.0)

## 2018-11-23 LAB — CBC WITH DIFFERENTIAL/PLATELET
Abs Immature Granulocytes: 0.01 10*3/uL (ref 0.00–0.07)
BASOS ABS: 0 10*3/uL (ref 0.0–0.1)
Basophils Relative: 1 %
Eosinophils Absolute: 0.1 10*3/uL (ref 0.0–0.5)
Eosinophils Relative: 1 %
HCT: 44 % (ref 39.0–52.0)
Hemoglobin: 14.3 g/dL (ref 13.0–17.0)
Immature Granulocytes: 0 %
Lymphocytes Relative: 12 %
Lymphs Abs: 0.7 10*3/uL (ref 0.7–4.0)
MCH: 31 pg (ref 26.0–34.0)
MCHC: 32.5 g/dL (ref 30.0–36.0)
MCV: 95.2 fL (ref 80.0–100.0)
Monocytes Absolute: 0.6 10*3/uL (ref 0.1–1.0)
Monocytes Relative: 11 %
Neutro Abs: 4.2 10*3/uL (ref 1.7–7.7)
Neutrophils Relative %: 75 %
Platelets: 214 10*3/uL (ref 150–400)
RBC: 4.62 MIL/uL (ref 4.22–5.81)
RDW: 12.8 % (ref 11.5–15.5)
WBC: 5.6 10*3/uL (ref 4.0–10.5)
nRBC: 0 % (ref 0.0–0.2)

## 2018-11-23 LAB — PROTIME-INR
INR: 1.09
Prothrombin Time: 14.1 seconds (ref 11.4–15.2)

## 2018-11-23 LAB — INFLUENZA PANEL BY PCR (TYPE A & B)
Influenza A By PCR: NEGATIVE
Influenza B By PCR: NEGATIVE

## 2018-11-23 MED ORDER — SODIUM CHLORIDE 0.9 % IV BOLUS
1000.0000 mL | Freq: Once | INTRAVENOUS | Status: AC
  Administered 2018-11-23: 1000 mL via INTRAVENOUS

## 2018-11-23 MED ORDER — ACETAMINOPHEN 325 MG PO TABS
650.0000 mg | ORAL_TABLET | Freq: Once | ORAL | Status: AC
  Administered 2018-11-23: 650 mg via ORAL
  Filled 2018-11-23: qty 2

## 2018-11-23 NOTE — ED Notes (Signed)
Pt pulled out both IV's in right hand and right AC. RN notified.

## 2018-11-23 NOTE — ED Notes (Signed)
Bed: OZ22 Expected date:  Expected time:  Means of arrival:  Comments: EMS-neck pain

## 2018-11-23 NOTE — Discharge Instructions (Addendum)
Follow up with your doctor

## 2018-11-23 NOTE — ED Triage Notes (Signed)
Transported by [facility] from [place]-- patient reports sudden onset of posterior neck pain that started last night. Denies recent injury. Pain 4/10.

## 2018-11-23 NOTE — ED Notes (Signed)
Dr. Rubin Payor notified of patient's lactic acid result of 2.22.

## 2018-11-23 NOTE — ED Provider Notes (Signed)
[facility]-EMERGENCY DEPT Provider Note   CSN: [id number] Arrival date & time: 11/23/18  0915     History   Chief Complaint Chief Complaint  Patient presents with  . Neck Pain    HPI Eliseo SquiresSiosaia Speiser is a 83 y.o. male.  HPI Patient presents with neck back pain and headache.  Reportedly started last night.  Found to have a fever here.  Has had a cough without real sputum production.  No sore throat.  No confusion.  No clear sick contacts.  No abdominal pain.  Somewhat hard of hearing at baseline.  No abdominal pain.  Has had some possible sick contacts.  Reviewing records it appears as if he has been gradually worsening and have been trying to get him to move out by family members however he has not done this yet. Past Medical History:  Diagnosis Date  . Arthritis   . HOH (hard of hearing)     Patient Active Problem List   Diagnosis Date Noted  . BPH (benign prostatic hyperplasia) 04/26/2012  . Dizziness - light-headed 04/26/2012  . Sinusitis 04/26/2012  . UTI (lower urinary tract infection) 04/26/2012  . Hypotension 04/26/2012  . Back pain 04/26/2012  . Acute kidney injury (HCC) 04/26/2012  . Hypokalemia 04/26/2012    No past surgical history on file.      Home Medications    Prior to Admission medications   Medication Sig Start Date End Date Taking? Authorizing Provider  amLODipine (NORVASC) 2.5 MG tablet Take 1 tablet (2.5 mg total) by mouth daily. Patient not taking: Reported on 10/30/2018 09/20/18   Mesner, Barbara CowerJason, MD  azithromycin (ZITHROMAX Z-PAK) 250 MG tablet 2 po day one, then 1 daily x 4 days Patient not taking: Reported on 11/23/2018 10/30/18   Geoffery Lyonselo, Douglas, MD  Lidocaine 4 % PTCH Apply 1 patch topically 2 (two) times daily. Patient not taking: Reported on 10/30/2018 09/20/18   Mesner, Barbara CowerJason, MD  meloxicam (MOBIC) 7.5 MG tablet Take 1 tablet (7.5 mg total) by mouth daily. Patient not taking: Reported on 10/30/2018 10/17/18    Jacalyn LefevreHaviland, Julie, MD  polyethylene glycol [facility](MIRALAX / Ethelene HalGLYCOLAX) packet Take 17 g by mouth daily as needed for mild constipation.    [provider]  potassium chloride (K-DUR) 10 MEQ tablet Take 1 tablet (10 mEq total) by mouth daily. Patient not taking: Reported on 10/30/2018 10/17/18   Jacalyn LefevreHaviland, Julie, MD    Family History Family History  Family history unknown: Yes    Social History Social History   Tobacco Use  . Smoking status: Never Smoker  . Smokeless tobacco: Never Used  Substance Use Topics  . Alcohol use: No  . Drug use: No     Allergies   Augmentin [amoxicillin-pot clavulanate]   Review of Systems Review of Systems  Constitutional: Negative for appetite change and fever.  HENT: Negative for congestion.   Respiratory: Positive for cough.   Cardiovascular: Negative for chest pain.  Gastrointestinal: Negative for abdominal pain.  Genitourinary: Negative for flank pain.  Musculoskeletal: Positive for back pain and neck pain.  Skin: Negative for rash.  Neurological: Positive for headaches. Negative for weakness.  Psychiatric/Behavioral: Negative for confusion.     Physical Exam Updated Vital Signs BP (!) 151/73 (BP Location: Left Arm)   Pulse 64   Temp (!) 101.8 F (38.8 C) (Oral)   Resp 13   Ht 5\' 8"  (1.727 m)   SpO2 96%   BMI 28.28 kg/m   Physical Exam HENT:  Head: Normocephalic.  Eyes:     Pupils: Pupils are equal, round, and reactive to light.  Neck:     Musculoskeletal: Neck supple.     Comments: No meningismus. Cardiovascular:     Rate and Rhythm: Regular rhythm.     Pulses: Normal pulses.     Heart sounds: No murmur.  Pulmonary:     Comments: Mildly harsh breath sounds. Abdominal:     Tenderness: There is no abdominal tenderness.  Musculoskeletal:     Right lower leg: No edema.     Left lower leg: No edema.  Skin:    General: Skin is warm.     Capillary Refill: Capillary refill takes less than 2 seconds.  Neurological:       General: No focal deficit present.     Mental Status: He is alert.  Psychiatric:        Mood and Affect: Mood normal.      ED Treatments / Results  Labs (all labs ordered are listed, but only abnormal results are displayed) Labs Reviewed  COMPREHENSIVE METABOLIC PANEL - Abnormal; Notable for the following components:      Result Value   Potassium 3.2 (*)    Glucose, Bld 149 (*)    Creatinine, Ser 1.30 (*)    Calcium 8.6 (*)    GFR calc non Af Amer 52 (*)    GFR calc Af Amer 60 (*)    All other components within normal limits  I-STAT CG4 LACTIC ACID, ED - Abnormal; Notable for the following components:   Lactic Acid, Venous 2.22 (*)    All other components within normal limits  CULTURE, BLOOD (ROUTINE X 2)  CULTURE, BLOOD (ROUTINE X 2)  CBC WITH DIFFERENTIAL/PLATELET  PROTIME-INR  URINALYSIS, ROUTINE W REFLEX MICROSCOPIC  INFLUENZA PANEL BY PCR (TYPE A & B)    EKG None  Radiology Dg Chest 2 View  Result Date: 11/23/2018 CLINICAL DATA:  Fever. EXAM: CHEST - 2 VIEW COMPARISON:  Radiographs of October 30, 2018. FINDINGS: Stable cardiomediastinal silhouette. No pneumothorax or pleural effusion is noted. Left lung is clear. Stable elevated right hemidiaphragm is noted with minimal right basilar subsegmental atelectasis or scarring. Bony thorax is unremarkable. IMPRESSION: Stable elevated right hemidiaphragm with minimal right basilar subsegmental atelectasis or scarring. No significant change compared to prior exam. Electronically Signed   By: Lupita Raider, M.D.   On: 11/23/2018 10:09    Procedures Procedures (including critical care time)  Medications Ordered in ED Medications  sodium chloride 0.9 % bolus 1,000 mL (0 mLs Intravenous Stopped 11/23/18 1306)  acetaminophen (TYLENOL) tablet 650 mg (650 mg Oral Given 11/23/18 1459)     Initial Impression / Assessment and Plan / ED Course  I have reviewed the triage vital signs and the nursing notes.  Pertinent labs &  imaging results that were available during my care of the patient were reviewed by me and considered in my medical decision making (see chart for details).     Patient with fever.  Has had cough which I think is the likely source.  Negative flu test.  Does not appear to have meningitis.  No meningismus.  No pain with leg raises.  Has some mild confusion but reviewing records appears to be at baseline.  Urine reassuring.  Lab work reassuring.  Normal white count.  Creatinine mildly elevated.  Fluid bolus given.  Respiratory rate down.Not tachycardic.  Will discharge home.  Although I think he is at high risk for  returning I did not see admission criteria at this time.  Final Clinical Impressions(s) / ED Diagnoses   Final diagnoses:  Fever, unspecified fever cause  Upper respiratory tract infection, unspecified type    ED Discharge Orders    None       Benjiman CorePickering, Dionisia Pacholski, MD 11/23/18 1506

## 2018-11-23 NOTE — ED Notes (Signed)
Pt removed both IV's, and monitoring devices. Pt states that he does not know why he removed them.

## 2018-11-28 LAB — CULTURE, BLOOD (ROUTINE X 2)
Culture: NO GROWTH
Culture: NO GROWTH
Special Requests: ADEQUATE
Special Requests: ADEQUATE

## 2019-03-22 ENCOUNTER — Emergency Department (HOSPITAL_COMMUNITY): Payer: Medicare Other

## 2019-03-22 ENCOUNTER — Encounter (HOSPITAL_COMMUNITY): Payer: Self-pay | Admitting: *Deleted

## 2019-03-22 ENCOUNTER — Emergency Department (HOSPITAL_COMMUNITY)
Admission: EM | Admit: 2019-03-22 | Discharge: 2019-03-22 | Disposition: A | Discharge status: Home / Self Care | Payer: Medicare Other | Attending: Emergency Medicine | Admitting: Emergency Medicine

## 2019-03-22 DIAGNOSIS — M25562 Pain in left knee: Secondary | ICD-10-CM | POA: Diagnosis not present

## 2019-03-22 DIAGNOSIS — M25462 Effusion, left knee: Secondary | ICD-10-CM | POA: Diagnosis not present

## 2019-03-22 DIAGNOSIS — Z760 Encounter for issue of repeat prescription: Secondary | ICD-10-CM | POA: Insufficient documentation

## 2019-03-22 DIAGNOSIS — M1712 Unilateral primary osteoarthritis, left knee: Secondary | ICD-10-CM | POA: Diagnosis not present

## 2019-03-22 MED ORDER — DICLOFENAC SODIUM 1 % TD GEL: 4.0000 g | Freq: Four times a day (QID) | TRANSDERMAL | 0 refills | Status: DC

## 2019-03-22 MED ORDER — ACETAMINOPHEN ER 650 MG PO TBCR: 650.0000 mg | EXTENDED_RELEASE_TABLET | Freq: Three times a day (TID) | ORAL | 0 refills | Status: DC | PRN

## 2019-03-22 MED ORDER — ACETAMINOPHEN 500 MG PO TABS
1000.0000 mg | ORAL_TABLET | Freq: Once | ORAL | Status: AC
  Administered 2019-03-22: 1000 mg via ORAL
  Filled 2019-03-22: qty 2

## 2019-03-22 NOTE — Discharge Instructions (Signed)
It was my pleasure taking care of you today!   Please call the primary care clinic or the orthopedist listed to schedule a follow up appointment.   Use two prescriptions as needed for pain.   Return to ER as needed.

## 2019-03-22 NOTE — ED Provider Notes (Signed)
[facility]-EMERGENCY DEPT Provider Note   CSN: [id number] Arrival date & time: 03/22/19  1456    History   Chief Complaint Chief Complaint  Patient presents with  . Knee Pain    HPI Eliseo SquiresSiosaia Tomberlin is a 83 y.o. male.     The history is provided by the patient and medical records. No language interpreter was used.  Knee Pain   Eliseo SquiresSiosaia Revere is a 83 y.o. male who presents to the Emergency Department complaining of intermittent lateral knee pain. This has been an ongoing issue for him. He has had pain to the knee for several years, but this does wax-and-wane. For the last few days, pain has been more severe than usual. He has a medication that his doctor gave him that really helps, but cannot remember the name of the medicine. He is now out of this medication. The pain is sharp in nature, worse with certain movements. No numbness or weakness. His primary care doctor did give him a walker to use for ambulation, but he does not use it very much. Usually just uses one crutch like a cane to keep him steady and does fine with this. No inciting event / injury / fall. Denies ever seeing an orthopedic doc in the past. No previous surgeries. Per chart review, he was seen by PCP for joint pain including knee pain where he did have blood work drawn including positive ANA. He is unsure of any autoimmune diagnosis. Does not appear he has followed up with them since that appointment.   Past Medical History:  Diagnosis Date  . Arthritis   . HOH (hard of hearing)     Patient Active Problem List   Diagnosis Date Noted  . BPH (benign prostatic hyperplasia) 04/26/2012  . Dizziness - light-headed 04/26/2012  . Sinusitis 04/26/2012  . UTI (lower urinary tract infection) 04/26/2012  . Hypotension 04/26/2012  . Back pain 04/26/2012  . Acute kidney injury (HCC) 04/26/2012  . Hypokalemia 04/26/2012    History reviewed. No pertinent surgical history.      Home  Medications    Prior to Admission medications   Medication Sig Start Date End Date Taking? Authorizing Provider  acetaminophen (TYLENOL 8 HOUR) 650 MG CR tablet Take 1 tablet (650 mg total) by mouth every 8 (eight) hours as needed for pain. 03/22/19   , Chase PicketJaime Pilcher, PA-C  amLODipine (NORVASC) 2.5 MG tablet Take 1 tablet (2.5 mg total) by mouth daily. Patient not taking: Reported on 10/30/2018 09/20/18   Mesner, Barbara CowerJason, MD  azithromycin (ZITHROMAX Z-PAK) 250 MG tablet 2 po day one, then 1 daily x 4 days Patient not taking: Reported on 11/23/2018 10/30/18   Geoffery Lyonselo, Douglas, MD  diclofenac sodium (VOLTAREN) 1 % GEL Apply 4 g topically 4 (four) times daily. 03/22/19   , Chase PicketJaime Pilcher, PA-C  Lidocaine 4 % PTCH Apply 1 patch topically 2 (two) times daily. Patient not taking: Reported on 10/30/2018 09/20/18   Mesner, Barbara CowerJason, MD  meloxicam (MOBIC) 7.5 MG tablet Take 1 tablet (7.5 mg total) by mouth daily. Patient not taking: Reported on 10/30/2018 10/17/18   Jacalyn LefevreHaviland, Julie, MD  polyethylene glycol [facility](MIRALAX / Ethelene HalGLYCOLAX) packet Take 17 g by mouth daily as needed for mild constipation.    [provider]  potassium chloride (K-DUR) 10 MEQ tablet Take 1 tablet (10 mEq total) by mouth daily. Patient not taking: Reported on 10/30/2018 10/17/18   Jacalyn LefevreHaviland, Julie, MD    Family History Family History  Family history  unknown: Yes    Social History Social History   Tobacco Use  . Smoking status: Never Smoker  . Smokeless tobacco: Never Used  Substance Use Topics  . Alcohol use: No  . Drug use: No     Allergies   Augmentin [amoxicillin-pot clavulanate]   Review of Systems Review of Systems  Musculoskeletal: Positive for arthralgias and myalgias. Negative for joint swelling.  All other systems reviewed and are negative.    Physical Exam Updated Vital Signs BP (!) 171/113 (BP Location: Right Arm)   Pulse 63   Temp 98.3 F (36.8 C) (Oral)   Resp 18   SpO2 97%   Physical Exam  Vitals signs and nursing note reviewed.  Constitutional:      General: He is not in acute distress.    Appearance: He is well-developed.  HENT:     Head: Normocephalic and atraumatic.  Neck:     Musculoskeletal: Neck supple.  Cardiovascular:     Rate and Rhythm: Normal rate and regular rhythm.     Heart sounds: Normal heart sounds. No murmur.  Pulmonary:     Effort: Pulmonary effort is normal. No respiratory distress.     Breath sounds: Normal breath sounds. No wheezing or rales.  Musculoskeletal:     Comments: Tenderness to palpation of left lateral knee. Full ROM although flexion/extension does reproduce pain. No joint effusion or swelling appreciated. No abnormal alignment or patellar mobility. No bruising, erythema or warmth overlaying the joint. No varus/valgus laxity. Negative drawer's. 2+ DP pulses bilaterally. All compartments are soft. Sensation intact distal to injury.  Skin:    General: Skin is warm and dry.  Neurological:     Mental Status: He is alert.      ED Treatments / Results  Labs (all labs ordered are listed, but only abnormal results are displayed) Labs Reviewed - No data to display  EKG None  Radiology Dg Knee Complete 4 Views Left  Result Date: 03/22/2019 CLINICAL DATA:  Left knee pain EXAM: LEFT KNEE - COMPLETE 4+ VIEW COMPARISON:  09/20/2018 FINDINGS: No fracture or dislocation of the left knee. 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 There is a small nonspecific knee joint effusion, likely present on prior radiographs. IMPRESSION: No fracture or dislocation of the left knee. 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 There is a small nonspecific knee joint effusion, likely present on prior radiographs. Electronically Signed   By: Lauralyn Primes M.D.   On: 03/22/2019 16:19    Procedures Procedures (including critical care time)  Medications Ordered in ED  Medications  acetaminophen (TYLENOL) tablet 1,000 mg (1,000 mg Oral Given 03/22/19 1542)     Initial Impression / Assessment and Plan / ED Course  I have reviewed the triage vital signs and the nursing notes.  Pertinent labs & imaging results that were available during my care of the patient were reviewed by me and considered in my medical decision making (see chart for details).       Artin Mceuen is a 83 y.o. male who presents to ED for acute worsening of what appears to be chronic knee pain.  He actually presented to the emergency department requesting refill of his medication that was given to him by primary care doctor.  After review with pharmacy technician, appears this is mobic.  Given his age and most recent creatinine of 1.3 just a few months ago, hesitant to start this gentleman  back on NSAIDs.  X-ray independently reviewed by myself and attending, Dr. Adela Lank, without signs of acute injury.  Does have signs of arthrosis.  We will have him follow-up with PCP or orthopedics for further evaluation.  Will start him on diclofenac gel and Tylenol regimen instead of oral NSAIDs. Symptomatic home care instructions and return precautions discussed. All questions answered.   Patient discussed with Dr. Adela Lank who agrees with treatment plan.    Final Clinical Impressions(s) / ED Diagnoses   Final diagnoses:  Left knee pain, unspecified chronicity    ED Discharge Orders         Ordered    diclofenac sodium (VOLTAREN) 1 % GEL  4 times daily     03/22/19 1654    acetaminophen (TYLENOL 8 HOUR) 650 MG CR tablet  Every 8 hours PRN     03/22/19 1654           , Chase Picket, PA-C 03/22/19 1659    Melene Plan, DO 03/22/19 1740

## 2019-04-18 ENCOUNTER — Encounter (HOSPITAL_COMMUNITY): Payer: Self-pay

## 2019-04-18 ENCOUNTER — Other Ambulatory Visit: Payer: Self-pay

## 2019-04-18 ENCOUNTER — Emergency Department (HOSPITAL_COMMUNITY)
Admission: EM | Admit: 2019-04-18 | Discharge: 2019-04-18 | Disposition: A | Discharge status: Home / Self Care | Payer: Medicare Other | Attending: Emergency Medicine | Admitting: Emergency Medicine

## 2019-04-18 DIAGNOSIS — M1990 Unspecified osteoarthritis, unspecified site: Secondary | ICD-10-CM | POA: Insufficient documentation

## 2019-04-18 DIAGNOSIS — F0631 Mood disorder due to known physiological condition with depressive features: Secondary | ICD-10-CM | POA: Insufficient documentation

## 2019-04-18 DIAGNOSIS — R45851 Suicidal ideations: Secondary | ICD-10-CM | POA: Diagnosis not present

## 2019-04-18 DIAGNOSIS — M179 Osteoarthritis of knee, unspecified: Secondary | ICD-10-CM | POA: Diagnosis not present

## 2019-04-18 DIAGNOSIS — M791 Myalgia, unspecified site: Secondary | ICD-10-CM | POA: Diagnosis present

## 2019-04-18 LAB — CBC WITH DIFFERENTIAL/PLATELET
Abs Immature Granulocytes: 0 10*3/uL (ref 0.00–0.07)
Basophils Absolute: 0 10*3/uL (ref 0.0–0.1)
Basophils Relative: 1 %
Eosinophils Absolute: 0.4 10*3/uL (ref 0.0–0.5)
Eosinophils Relative: 10 %
HCT: 41.9 % (ref 39.0–52.0)
Hemoglobin: 13.8 g/dL (ref 13.0–17.0)
Immature Granulocytes: 0 %
Lymphocytes Relative: 35 %
Lymphs Abs: 1.3 10*3/uL (ref 0.7–4.0)
MCH: 32.3 pg (ref 26.0–34.0)
MCHC: 32.9 g/dL (ref 30.0–36.0)
MCV: 98.1 fL (ref 80.0–100.0)
Monocytes Absolute: 0.4 10*3/uL (ref 0.1–1.0)
Monocytes Relative: 11 %
Neutro Abs: 1.6 10*3/uL — ABNORMAL LOW (ref 1.7–7.7)
Neutrophils Relative %: 43 %
Platelets: 207 10*3/uL (ref 150–400)
RBC: 4.27 MIL/uL (ref 4.22–5.81)
RDW: 13.2 % (ref 11.5–15.5)
WBC: 3.7 10*3/uL — ABNORMAL LOW (ref 4.0–10.5)
nRBC: 0 % (ref 0.0–0.2)

## 2019-04-18 LAB — COMPREHENSIVE METABOLIC PANEL
ALT: 14 U/L (ref 0–44)
AST: 26 U/L (ref 15–41)
Albumin: 3.6 g/dL (ref 3.5–5.0)
Alkaline Phosphatase: 65 U/L (ref 38–126)
Anion gap: 7 (ref 5–15)
BUN: 13 mg/dL (ref 8–23)
CO2: 26 mmol/L (ref 22–32)
Calcium: 8.6 mg/dL — ABNORMAL LOW (ref 8.9–10.3)
Chloride: 106 mmol/L (ref 98–111)
Creatinine, Ser: 0.97 mg/dL (ref 0.61–1.24)
GFR calc Af Amer: >60 mL/min (ref >60)
GFR calc non Af Amer: >60 mL/min (ref >60)
Glucose, Bld: 107 mg/dL — ABNORMAL HIGH (ref 70–99)
Potassium: 3.5 mmol/L (ref 3.5–5.1)
Sodium: 139 mmol/L (ref 135–145)
Total Bilirubin: 0.9 mg/dL (ref 0.3–1.2)
Total Protein: 6.6 g/dL (ref 6.5–8.1)

## 2019-04-18 LAB — TSH: TSH: 5.559 u[IU]/mL — ABNORMAL HIGH (ref 0.350–4.500)

## 2019-04-18 LAB — ETHANOL: Alcohol, Ethyl (B): <10 mg/dL (ref <10)

## 2019-04-18 MED ORDER — ACETAMINOPHEN 500 MG PO TABS: 1000.0000 mg | ORAL_TABLET | Freq: Four times a day (QID) | ORAL | 0 refills | Status: DC | PRN

## 2019-04-18 MED ORDER — AMLODIPINE BESYLATE 5 MG PO TABS: 5.0000 mg | ORAL_TABLET | Freq: Every day | ORAL | 1 refills | Status: DC

## 2019-04-18 MED ORDER — ACETAMINOPHEN 500 MG PO TABS
1000.0000 mg | ORAL_TABLET | Freq: Once | ORAL | Status: AC
  Administered 2019-04-18: 1000 mg via ORAL
  Filled 2019-04-18: qty 2

## 2019-04-18 MED ORDER — AMLODIPINE BESYLATE 5 MG PO TABS
5.0000 mg | ORAL_TABLET | Freq: Once | ORAL | Status: AC
  Administered 2019-04-18: 5 mg via ORAL
  Filled 2019-04-18: qty 1

## 2019-04-18 NOTE — TOC Initial Note (Addendum)
Transition of Care [facility]) - Initial/Assessment Note    Patient Details  Name: Thomas Rich MRN: [id number] Date of Birth: 1936/02/09  Transition of [facility]) CM/SW Contact:    Montine Circle, LCSW Phone Number: 04/18/2019, 1:36 PM  Clinical Narrative:                 CSW spoke with patient via bedside. Patient states he came to the ED today for wrist pain and depression/passive SI related to the pain. Patient was evaluated by TTS prior to CSW speaking with patient and he was cleared for psych. Patient reports he lives alone in a house his son in [place] assisted him with getting. Patient reports he has a plane ticket to move closer to his family on the [place]. Patient reports he has much support from his 3 son's, but recently lost their telephone numbers. Patient allowed CSW to look at the phone and there were no saved numbers. Patient provided CSW with his landlord's number, Kerry Hough [phone]). Patient reports his landlord has his family's contact information. CSW attempted to call for collerateral information, but did not get an answer. Patient declined to have CSW contact his emergency contact listed on his facesheet. Patient reports he feels safe at home. He reports he prefers to take the bus home. CSW provided patient with list of OP BH services. He denied SI/HI and all other safety concerns.  Expected Discharge Plan: Home/Self Care Barriers to Discharge: No Barriers Identified   Patient Goals and CMS Choice Patient states their goals for this hospitalization and ongoing recovery are:: taking care of wrist pain      Expected Discharge Plan and Services Expected Discharge Plan: Home/Self Care   Discharge Planning Services: NA   Living arrangements for the past 2 months: Single Family Home                                      Prior Living Arrangements/Services Living arrangements for the past 2 months: Single Family Home Lives with::  Self Patient language and need for interpreter reviewed:: Yes(no need for interperter) Do you feel safe going back to the place where you live?: Yes      Need for Family Participation in Patient Care: Yes (Comment) Care giver support system in place?: Yes (comment)   Criminal Activity/Legal Involvement Pertinent to Current Situation/Hospitalization: No - Comment as needed  Activities of Daily Living   ADL Screening (condition at time of admission) Patient's cognitive ability adequate to safely complete daily activities?: Yes Is the patient deaf or have difficulty hearing?: Yes Does the patient have difficulty seeing, even when wearing glasses/contacts?: No Does the patient have difficulty concentrating, remembering, or making decisions?: No Patient able to express need for assistance with ADLs?: Yes Does the patient have difficulty dressing or bathing?: No Independently performs ADLs?: Yes (appropriate for developmental age) Does the patient have difficulty walking or climbing stairs?: No Weakness of Legs: None Weakness of Arms/Hands: None  Permission Sought/Granted Permission sought to share information with : Family Supports, Other (comment)(Landlord) Permission granted to share information with : Yes, Verbal Permission Granted  Share Information with NAME: Kerry Hough      Permission granted to share info w Relationship: Landlord  Permission granted to share info w Contact Information: [phone]  Emotional Assessment Appearance:: Disheveled, Appears stated age Attitude/Demeanor/Rapport: Engaged Affect (typically observed): Happy Orientation: : Oriented to Place, Oriented  to Self, Oriented to Situation, Oriented to  Time Alcohol / Substance Use: Never Used Psych Involvement: No (comment)  Admission diagnosis:  shoulder and neck pain Patient Active Problem List   Diagnosis Date Noted  . BPH (benign prostatic hyperplasia) 04/26/2012  . Dizziness - light-headed 04/26/2012   . Sinusitis 04/26/2012  . UTI (lower urinary tract infection) 04/26/2012  . Hypotension 04/26/2012  . Back pain 04/26/2012  . Acute kidney injury (HCC) 04/26/2012  . Hypokalemia 04/26/2012   PCP:  Patient, No Pcp Per Pharmacy:   [id number] [place], [place] - [address] [facility] [address] [place] [id number] Phone: [phone] Fax: [phone]     Social Determinants of Health (SDOH) Interventions    Readmission Risk Interventions No flowsheet data found.

## 2019-04-18 NOTE — BH Assessment (Addendum)
Tele Assessment Note   Patient Name: Thomas Rich MRN: [id number] Referring Physician: Donnald Garre Location of Patient: [facility] ED Location of Provider: Behavioral Health TTS Department  Thomas Rich is an 83 y.o. male presenting voluntarily to [facility] ED complaining of persistent physical pain in both his wrists, hands, and knees. He states that at times the pain is so severe he feels suicidal at times. Patient denies current SI but would like assistance with mental health resources. Patient denies HI/AVH. Patient does not have any prior attempts or any history of psychiatric treatment. Patient denies any substance use. Patient has a supportive family that mainly live in [place]. Patient reports his son has purchased a plane ticket for him to move and they are currently trying to coordinate moving his things. He currently lives alone in a home that his son owns.   Patient is alert and oriented x 4. He is dressed appropriately and sitting up in bed. He is friendly and cooperative throughout assessment. His speech is logical, eye contact is good, and thoughts are organized. His mood is anxious and his affect is appropriate to circumstances. He has good insight, judgement, and impulse control. Patient does not appear to be responding to internal stimuli or experiencing delusional thought content.   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 Arthritis   . HOH (hard of hearing)     History reviewed. No pertinent surgical history.  Family History:  Family History  Family history unknown: Yes    Social History:  reports that he has never smoked. He has never used smokeless tobacco. He reports that he does not drink alcohol or use drugs.  Additional Social History:  Alcohol / Drug Use Pain Medications: see MAR Prescriptions: see MAR Over the Counter: see MAR History of alcohol / drug use?:  No history of alcohol / drug abuse  CIWA: CIWA-Ar BP: (!) 155/100 Pulse Rate: 80 COWS:    Allergies:  Allergies  Allergen Reactions  . Augmentin [Amoxicillin-Pot Clavulanate] Rash    Has patient had a PCN reaction causing immediate rash, facial/tongue/throat swelling, SOB or lightheadedness with hypotension: unknown Has patient had a PCN reaction causing severe rash involving mucus membranes or skin necrosis: unknown Has patient had a PCN reaction that required hospitalization unknown Has patient had a PCN reaction occurring within the last 10 years: unknown If all of the above answers are "NO", then may proceed with Cephalosporin use.     Home Medications: (Not in a hospital admission)   OB/GYN Status:  No LMP for male patient.  General Assessment Data Assessment unable to be completed: Yes(TTS waited 30 minutes for cart to be set up) Location of Assessment: [facility] ED TTS Assessment: In system Is this a Tele or Face-to-Face Assessment?: Tele Assessment Is this an Initial Assessment or a Re-assessment for this encounter?: Initial Assessment Patient Accompanied by:: N/A Language Other than English: No Living Arrangements: (home his son owns) What gender do you identify as?: Male Marital status: Divorced Pregnancy Status: No Living Arrangements: Alone Can pt return to current living arrangement?: Yes Admission Status: Voluntary Is patient capable of signing voluntary admission?: Yes Referral Source: Self/Family/Friend Insurance type: Medicare     Crisis Care Plan Living Arrangements: Alone Legal Guardian: (self) Name of Psychiatrist: none Name of Therapist: none  Education Status Is patient currently in school?: No Is the patient employed, unemployed or receiving disability?: Unemployed(retired)  Risk to self  with the past 6 months Suicidal Ideation: No-Not Currently/Within Last 6 Months Has patient been a risk to self within the past 6 months prior to admission? :  No Suicidal Intent: No Has patient had any suicidal intent within the past 6 months prior to admission? : No Is patient at risk for suicide?: No Suicidal Plan?: No Has patient had any suicidal plan within the past 6 months prior to admission? : No Access to Means: No What has been your use of drugs/alcohol within the last 12 months?: denies Previous Attempts/Gestures: No How many times?: 0 Other Self Harm Risks: none noted Triggers for Past Attempts: None known Intentional Self Injurious Behavior: None Family Suicide History: No Recent stressful life event(s): Recent negative physical changes Persecutory voices/beliefs?: No Depression: Yes Depression Symptoms: Insomnia, Despondent, Isolating, Loss of interest in usual pleasures Substance abuse history and/or treatment for substance abuse?: No Suicide prevention information given to non-admitted patients: Not applicable  Risk to Others within the past 6 months Homicidal Ideation: No Does patient have any lifetime risk of violence toward others beyond the six months prior to admission? : No Thoughts of Harm to Others: No Current Homicidal Intent: No Current Homicidal Plan: No Access to Homicidal Means: No Identified Victim: none History of harm to others?: No Assessment of Violence: None Noted Violent Behavior Description: none noted Does patient have access to weapons?: No Criminal Charges Pending?: No Does patient have a court date: No Is patient on probation?: No  Psychosis Hallucinations: None noted Delusions: None noted  Mental Status Report Appearance/Hygiene: Unremarkable Eye Contact: Fair Motor Activity: Rigidity Speech: Logical/coherent Level of Consciousness: Alert Mood: Depressed Affect: Depressed Anxiety Level: None Thought Processes: Coherent, Relevant Judgement: Partial Orientation: Person, Time, Place, Situation Obsessive Compulsive Thoughts/Behaviors: None  Cognitive Functioning Concentration:  Normal Memory: Recent Intact, Remote Intact Is patient IDD: No Insight: Fair Impulse Control: Good Appetite: Good Have you had any weight changes? : No Change Sleep: No Change Total Hours of Sleep: 8 Vegetative Symptoms: None  ADLScreening [facility] Assessment Services) Patient's cognitive ability adequate to safely complete daily activities?: Yes Patient able to express need for assistance with ADLs?: Yes Independently performs ADLs?: Yes (appropriate for developmental age)  Prior Inpatient Therapy Prior Inpatient Therapy: No  Prior Outpatient Therapy Prior Outpatient Therapy: No Does patient have an ACCT team?: No Does patient have Intensive In-House Services?  : No Does patient have [facility] services? : No Does patient have P4CC services?: No  ADL Screening (condition at time of admission) Patient's cognitive ability adequate to safely complete daily activities?: Yes Is the patient deaf or have difficulty hearing?: Yes Does the patient have difficulty seeing, even when wearing glasses/contacts?: No Does the patient have difficulty concentrating, remembering, or making decisions?: No Patient able to express need for assistance with ADLs?: Yes Does the patient have difficulty dressing or bathing?: No Independently performs ADLs?: Yes (appropriate for developmental age) Does the patient have difficulty walking or climbing stairs?: No Weakness of Legs: None Weakness of Arms/Hands: None     Therapy Consults (therapy consults require a physician order) PT Evaluation Needed: No OT Evalulation Needed: No SLP Evaluation Needed: No Abuse/Neglect Assessment (Assessment to be complete while patient is alone) Abuse/Neglect Assessment Can Be Completed: Yes Physical Abuse: Denies Verbal Abuse: Denies Sexual Abuse: Denies Exploitation of patient/patient's resources: Denies Self-Neglect: Denies Values / Beliefs Cultural Requests During Hospitalization: None Spiritual Requests During  Hospitalization: None Consults Spiritual Care Consult Needed: No Social Work Consult Needed: No Merchant navy officer (For  Healthcare) Does Patient Have a Medical Advance Directive?: No Would patient like information on creating a medical advance directive?: No - Patient declined          Disposition: Per Dr. Sharma CovertNorman patient does not meet in patient criteria. Patient is psych cleared and provided with outpatient resources. Disposition Initial Assessment Completed for this Encounter: Yes  This service was provided via telemedicine using a 2-way, interactive audio and video technology.  Names of all persons participating in this telemedicine service and their role in this encounter. Name: Thomas Rich Role: patient   Name: Thomas MiyamotoMeredith Vencil Basnett, LCSW Role: TTS  Name:  Role:   Name:  Role:     Thomas MiyamotoMeredith  Willmer Fellers 04/18/2019 12:24 PM

## 2019-04-18 NOTE — ED Triage Notes (Signed)
Pt is alert and orinted  x4 and is verbally responsive. Pt is c/o neck and shoulder pain. Pt also desires to speak with MSW. Pt feels that his family does not think he can care for himself at this time.

## 2019-04-18 NOTE — BH Assessment (Signed)
[facility] Assessment Progress Note  Per Juanetta Beets, DO, this pt, who reportedly goes by "Thomas Rich," does not require psychiatric hospitalization at this time.  Pt is psychiatrically cleared, but would benefit from outpatient behavioral health services.  This Clinical research associate has spoke to pt, and he would like to have an appointment scheduled for him.  At 14:00 I spoke to [place] at the [facility] [place].  She has scheduled pt for an intake telephone call with Angus Palms, LCSW on Friday, 04/20/2019 at 12:00.   This has been included in pt's discharge instructions.  Pt has also been provided with an intake paperwork packet for pt to complete at this own convenience.  Instructions advise pt accordingly.  Pt's nurse has been notified.  At 14:16 this Clinical research associate spoke to EDP Arby Barrette, MD to notify her of pt's psychiatric disposition.  She reports that she also intends to order a social work consult.  Doylene Canning, MA Triage Specialist [phone]

## 2019-04-18 NOTE — Progress Notes (Signed)
CSW spoke with EDP about concerns of patient's safety and patient presenting with a similar story during past ED visits. At the request of EDP, CSW contacted the non-emergency number for a well-fare check of the home and to ensure patient lives at the address listed. Per non-emergency, the home is owned by the landlord, but that is all the information they could give. No need for well-fare check since patient lives alone and the possibly of no one answering the door. CSW informed EDP of this. No further CSW needs at this time. Please reconsult if needs arise.   Geralyn Corwin, LCSW Transitions of Care Department [facility] ED [phone]

## 2019-04-18 NOTE — Discharge Instructions (Addendum)
For your behavioral health needs you are advised to follow up with an outpatient provider.  You have been scheduled for an intake telephone call with Angus Palms, LCSW, at the [facility] at [facility].  Plan to receive a call from her on Friday, Apr 20, 2019 at 12:00 pm.  Please fill out the intake paperwork provided by [facility] Department staff at your convenience.  You may either mail it back to the address listed below, or take it with you to your first in-person visit:       [facility]      [address]      [place], [place] [address]      [phone]      Contact person: Angus Palms, [place]

## 2019-04-18 NOTE — ED Provider Notes (Signed)
[facility]-EMERGENCY DEPT Provider Note   CSN: [id number] Arrival date & time: 04/18/19  1022    History   Chief Complaint Chief Complaint  Patient presents with  . Generalized Body Aches  . Suicidal    HPI Thomas Thomas Rich is a 83 y.o. male.     HPI Patient has several concerns today.  He reports he deals persistently with a lot of pain in both of his wrists and hands.  It also is a problem in the knees.  He has a crutches that have been given to him at the hospital previously and he uses that frequently to help with his gait.  He reports that he has started to fall periodically.  He has been treated previously with anti-inflammatories for arthritis.  He reports the medications are often helpful.  He reports however, he is in pain so frequently and is really depressing, figuring out how to be in pain most of the time and not having really any options of anything to do about it.  He reports is really to the point that he sometimes thinks it would be better if he just died.  Some days he does not really feel like going on living.  He does not have a plan for killing himself.  He reports he does have sons but they [place] the [place].  He reports that he had been talking to them and they encouraged him to come into the hospital to get evaluated.  He states is an option for him to go at any time to live with his sons but he just has not felt ready yet.  For one thing, he wanted to see a doctor and get evaluated before he moved anyways.  He has not had fever, chills, nausea vomiting, cough, abdominal pain.  Patient has a very extensive and interesting family story.  He is from [place] and reports he was educated in [place].  He reports he has helped his family members to become established in their careers.  He indicates that his sons are involved in real [place].  He has nieces and nephews working in government jobs. Past Medical History:  Diagnosis  Date  . Arthritis   . HOH (hard of hearing)     Patient Active Problem List   Diagnosis Date Noted  . BPH (benign prostatic hyperplasia) 04/26/2012  . Dizziness - light-headed 04/26/2012  . Sinusitis 04/26/2012  . UTI (lower urinary tract infection) 04/26/2012  . Hypotension 04/26/2012  . Back pain 04/26/2012  . Acute kidney injury (HCC) 04/26/2012  . Hypokalemia 04/26/2012    History reviewed. No pertinent surgical history.      Home Medications    Prior to Admission medications   Medication Sig Start Date End Date Taking? Authorizing Provider  diclofenac sodium (VOLTAREN) 1 % GEL Apply 4 g topically 4 (four) times daily. 03/22/19  Yes Ward, Chase PicketJaime Pilcher, PA-C  acetaminophen (TYLENOL 8 HOUR) 650 MG CR tablet Take 1 tablet (650 mg total) by mouth every 8 (eight) hours as needed for pain. Patient not taking: Reported on 04/18/2019 03/22/19   Ward, Chase PicketJaime Pilcher, PA-C  acetaminophen (TYLENOL) 500 MG tablet Take 2 tablets (1,000 mg total) by mouth every 6 (six) hours as needed. 04/18/19   Arby BarrettePfeiffer, Kamani Lewter, MD  amLODipine (NORVASC) 2.5 MG tablet Take 1 tablet (2.5 mg total) by mouth daily. Patient not taking: Reported on 10/30/2018 09/20/18   Mesner, Barbara CowerJason, MD  amLODipine (NORVASC) 5 MG  tablet Take 1 tablet (5 mg total) by mouth daily. 04/18/19   Arby Barrette, MD  azithromycin (ZITHROMAX Z-PAK) 250 MG tablet 2 po day one, then 1 daily x 4 days Patient not taking: Reported on 11/23/2018 10/30/18   Geoffery Lyons, MD  Lidocaine 4 % PTCH Apply 1 patch topically 2 (two) times daily. Patient not taking: Reported on 10/30/2018 09/20/18   Mesner, Barbara Cower, MD  meloxicam (MOBIC) 7.5 MG tablet Take 1 tablet (7.5 mg total) by mouth daily. Patient not taking: Reported on 10/30/2018 10/17/18   Jacalyn Lefevre, MD  potassium chloride (K-DUR) 10 MEQ tablet Take 1 tablet (10 mEq total) by mouth daily. Patient not taking: Reported on 10/30/2018 10/17/18   Jacalyn Lefevre, MD    Family History Family  History  Family history unknown: Yes    Social History Social History   Tobacco Use  . Smoking status: Never Smoker  . Smokeless tobacco: Never Used  Substance Use Topics  . Alcohol use: No  . Drug use: No     Allergies   Augmentin [amoxicillin-pot clavulanate]   Review of Systems Review of Systems 10 Systems reviewed and are negative for acute change except as noted in the HPI.   Physical Exam Updated Vital Signs BP (!) 151/89 (BP Location: Right Arm)   Pulse 80   Temp 98.6 F (37 C) (Oral)   Resp 20   SpO2 100%   Physical Exam Constitutional:      Comments: Patient is alert and interactive.  No respiratory distress.  HENT:     Head: Normocephalic and atraumatic.     Mouth/Throat:     Mouth: Mucous membranes are moist.     Pharynx: Oropharynx is clear.  Eyes:     Extraocular Movements: Extraocular movements intact.     Pupils: Pupils are equal, round, and reactive to light.  Neck:     Musculoskeletal: Neck supple.  Cardiovascular:     Rate and Rhythm: Normal rate and regular rhythm.  Pulmonary:     Effort: Pulmonary effort is normal.     Breath sounds: Normal breath sounds.  Abdominal:     General: There is no distension.     Palpations: Abdomen is soft.     Tenderness: There is no abdominal tenderness. There is no guarding.  Musculoskeletal:     Comments: Patient has bony enlargement of the wrists bilaterally and the joints of the hand consistent with arthritis.  There is no erythema or effusions.  Soft tissues of the forearms and upper arms are normal.  Patient also has bony enlargement of both knees that is consistent with arthritis.  There does appear to be a small amount of effusion in the right knee.  No erythema.  Lower legs are soft.  Patient does have trace pitting edema bilaterally.  Skin:    General: Skin is warm and dry.  Neurological:     General: No focal deficit present.     Mental Status: He is oriented to person, place, and time.      Coordination: Coordination normal.  Psychiatric:        Mood and Affect: Mood normal.      ED Treatments / Results  Labs (all labs ordered are listed, but only abnormal results are displayed) Labs Reviewed  COMPREHENSIVE METABOLIC PANEL - Abnormal; Notable for the following components:      Result Value   Glucose, Bld 107 (*)    Calcium 8.6 (*)    All other components within  normal limits  CBC WITH DIFFERENTIAL/PLATELET - Abnormal; Notable for the following components:   WBC 3.7 (*)    Neutro Abs 1.6 (*)    All other components within normal limits  TSH - Abnormal; Notable for the following components:   TSH 5.559 (*)    All other components within normal limits  ETHANOL  URINALYSIS, ROUTINE W REFLEX MICROSCOPIC  RAPID URINE DRUG SCREEN, HOSP PERFORMED  T4  T3, FREE    EKG None  Radiology No results found.  Procedures Procedures (including critical care time)  Medications Ordered in ED Medications  acetaminophen (TYLENOL) tablet 1,000 mg (1,000 mg Oral Given 04/18/19 1240)  amLODipine (NORVASC) tablet 5 mg (5 mg Oral Given 04/18/19 1451)     Initial Impression / Assessment and Plan / ED Course  I have reviewed the triage vital signs and the nursing notes.  Pertinent labs & imaging results that were available during my care of the patient were reviewed by me and considered in my medical decision making (see chart for details).       Patient appears to have chronic musculoskeletal pain which appears likely to be arthritic in nature.  There does not appear to be acute infectious problems with any of the involved joints.  Pain and bony hypertrophy are pretty diffuse suggesting osteoarthritis.  Patient does report increased frequency of falls.  This may be due to pain and instability of the knees.  Will obtain x-rays of the knees.  Will obtain basic lab work.  It appears the other significant presenting issue for the patient is severe depression due to chronic pain in  possibly isolation from his family.  He does express thoughts of passive suicidal ideation.  Will consult TTS and social work.  TTS has evaluated and does not find indication for inpatient treatment at this time.  He does not have active suicidal plans.  Predominantly he seems depressed about dealing with pain which appears to be diffuse OA.  Social work also involved to help assess patient's social contacts and living arrangements.  Patient is otherwise stable.  He wishes to be discharged.  At this time no indication of necessity for IVC.  Final Clinical Impressions(s) / ED Diagnoses   Final diagnoses:  Arthritis  Suicidal ideations    ED Discharge Orders         Ordered    acetaminophen (TYLENOL) 500 MG tablet  Every 6 hours PRN     04/18/19 1512    amLODipine (NORVASC) 5 MG tablet  Daily     04/18/19 1513           Arby Barrette, MD 04/18/19 1702

## 2019-04-19 LAB — T4: T4, Total: 8.4 ug/dL (ref 4.5–12.0)

## 2019-04-19 LAB — T3, FREE: T3, Free: 2.6 pg/mL (ref 2.0–4.4)

## 2019-04-20 ENCOUNTER — Ambulatory Visit (HOSPITAL_COMMUNITY): Payer: Medicare Other | Admitting: Licensed Clinical Social Worker

## 2019-04-20 ENCOUNTER — Telehealth (HOSPITAL_COMMUNITY): Payer: Self-pay | Admitting: Licensed Clinical Social Worker

## 2019-06-10 ENCOUNTER — Emergency Department (HOSPITAL_COMMUNITY)
Admission: EM | Admit: 2019-06-10 | Discharge: 2019-06-10 | Disposition: A | Discharge status: Home / Self Care | Payer: Medicare Other | Attending: Emergency Medicine | Admitting: Emergency Medicine

## 2019-06-10 ENCOUNTER — Encounter (HOSPITAL_COMMUNITY): Payer: Self-pay

## 2019-06-10 ENCOUNTER — Other Ambulatory Visit: Payer: Self-pay

## 2019-06-10 DIAGNOSIS — M2550 Pain in unspecified joint: Secondary | ICD-10-CM

## 2019-06-10 DIAGNOSIS — Z79899 Other long term (current) drug therapy: Secondary | ICD-10-CM | POA: Diagnosis not present

## 2019-06-10 DIAGNOSIS — M542 Cervicalgia: Secondary | ICD-10-CM | POA: Diagnosis not present

## 2019-06-10 DIAGNOSIS — M25561 Pain in right knee: Secondary | ICD-10-CM | POA: Diagnosis not present

## 2019-06-10 DIAGNOSIS — M25531 Pain in right wrist: Secondary | ICD-10-CM | POA: Diagnosis not present

## 2019-06-10 DIAGNOSIS — M25562 Pain in left knee: Secondary | ICD-10-CM | POA: Diagnosis not present

## 2019-06-10 MED ORDER — TRAMADOL HCL 50 MG PO TABS: 50.0000 mg | ORAL_TABLET | Freq: Four times a day (QID) | ORAL | 0 refills | Status: DC | PRN

## 2019-06-10 MED ORDER — PREDNISONE 10 MG PO TABS: 20.0000 mg | ORAL_TABLET | Freq: Two times a day (BID) | ORAL | 0 refills | Status: DC

## 2019-06-10 NOTE — Discharge Instructions (Addendum)
Begin taking prednisone as prescribed.  Tramadol as prescribed as needed for pain.  Follow-up with a primary doctor in the next 1 to 2 weeks.

## 2019-06-10 NOTE — ED Triage Notes (Signed)
He c/o polyarthralgias and denies fever, nor any sign of illness and is in no distress. He is ambulatory. He tells [place] "I was here for this before and I took the medicine, but I feel the same".

## 2019-06-10 NOTE — ED Provider Notes (Signed)
[facility]-EMERGENCY DEPT Provider Note   CSN: [id number] Arrival date & time: 06/10/19  1351     History   Chief Complaint Chief Complaint  Patient presents with  . Joint Pain    HPI Eliseo SquiresSiosaia Mannings is a 83 y.o. male.     Patient is an 83 year old male with history of arthritis presenting with complaints of joint pain.  Patient describes pain to both wrists, both knees, his neck, and his back that has been present for several months.  He was seen here on 1 previous occasion for similar complaints.  He was prescribed medication which helped for short period of time, now symptoms have returned.  He denies any fevers or chills.  He denies any injury or trauma.  Pain is worse with movement and relieved with rest.  The history is provided by the patient.    Past Medical History:  Diagnosis Date  . Arthritis   . HOH (hard of hearing)     Patient Active Problem List   Diagnosis Date Noted  . BPH (benign prostatic hyperplasia) 04/26/2012  . Dizziness - light-headed 04/26/2012  . Sinusitis 04/26/2012  . UTI (lower urinary tract infection) 04/26/2012  . Hypotension 04/26/2012  . Back pain 04/26/2012  . Acute kidney injury (HCC) 04/26/2012  . Hypokalemia 04/26/2012    No past surgical history on file.      Home Medications    Prior to Admission medications   Medication Sig Start Date End Date Taking? Authorizing Provider  acetaminophen (TYLENOL 8 HOUR) 650 MG CR tablet Take 1 tablet (650 mg total) by mouth every 8 (eight) hours as needed for pain. Patient not taking: Reported on 04/18/2019 03/22/19   Ward, Chase PicketJaime Pilcher, PA-C  acetaminophen (TYLENOL) 500 MG tablet Take 2 tablets (1,000 mg total) by mouth every 6 (six) hours as needed. 04/18/19   Arby BarrettePfeiffer, Marcy, MD  amLODipine (NORVASC) 2.5 MG tablet Take 1 tablet (2.5 mg total) by mouth daily. Patient not taking: Reported on 10/30/2018 09/20/18   Mesner, Barbara CowerJason, MD  amLODipine (NORVASC) 5 MG  tablet Take 1 tablet (5 mg total) by mouth daily. 04/18/19   Arby BarrettePfeiffer, Marcy, MD  azithromycin (ZITHROMAX Z-PAK) 250 MG tablet 2 po day one, then 1 daily x 4 days Patient not taking: Reported on 11/23/2018 10/30/18   Geoffery Lyonselo, Platon Arocho, MD  diclofenac sodium (VOLTAREN) 1 % GEL Apply 4 g topically 4 (four) times daily. 03/22/19   Ward, Chase PicketJaime Pilcher, PA-C  Lidocaine 4 % PTCH Apply 1 patch topically 2 (two) times daily. Patient not taking: Reported on 10/30/2018 09/20/18   Mesner, Barbara CowerJason, MD  meloxicam (MOBIC) 7.5 MG tablet Take 1 tablet (7.5 mg total) by mouth daily. Patient not taking: Reported on 10/30/2018 10/17/18   Jacalyn LefevreHaviland, Julie, MD  potassium chloride (K-DUR) 10 MEQ tablet Take 1 tablet (10 mEq total) by mouth daily. Patient not taking: Reported on 10/30/2018 10/17/18   Jacalyn LefevreHaviland, Julie, MD    Family History Family History  Family history unknown: Yes    Social History Social History   Tobacco Use  . Smoking status: Never Smoker  . Smokeless tobacco: Never Used  Substance Use Topics  . Alcohol use: No  . Drug use: No     Allergies   Augmentin [amoxicillin-pot clavulanate]   Review of Systems Review of Systems  All other systems reviewed and are negative.    Physical Exam Updated Vital Signs BP 140/90 (BP Location: Right Arm)   Pulse 62   Temp 99  F (37.2 C) (Oral)   Resp 20   SpO2 100%   Physical Exam Vitals signs and nursing note reviewed.  Constitutional:      General: He is not in acute distress.    Appearance: Normal appearance. He is not ill-appearing, toxic-appearing or diaphoretic.  HENT:     Head: Normocephalic and atraumatic.  Pulmonary:     Effort: Pulmonary effort is normal.  Musculoskeletal:     Comments: Bilateral wrists and knees appear grossly normal with no significant effusion.  He does have pain with range of motion.  Distal PMS is intact to all extremities.  Skin:    General: Skin is warm and dry.  Neurological:     Mental Status: He is alert  and oriented to person, place, and time.      ED Treatments / Results  Labs (all labs ordered are listed, but only abnormal results are displayed) Labs Reviewed - No data to display  EKG None  Radiology No results found.  Procedures Procedures (including critical care time)  Medications Ordered in ED Medications - No data to display   Initial Impression / Assessment and Plan / ED Course  I have reviewed the triage vital signs and the nursing notes.  Pertinent labs & imaging results that were available during my care of the patient were reviewed by me and considered in my medical decision making (see chart for details).  Patient appears to be having a flareup of some sort of arthritis.  This will be treated with prednisone and tramadol.  He is to follow-up with a primary doctor if his symptoms are not improving.  Final Clinical Impressions(s) / ED Diagnoses   Final diagnoses:  None    ED Discharge Orders    None       Veryl Speak, MD 06/10/19 1557

## 2019-06-21 ENCOUNTER — Other Ambulatory Visit: Payer: Self-pay

## 2019-06-22 ENCOUNTER — Other Ambulatory Visit: Payer: Self-pay

## 2019-06-22 ENCOUNTER — Emergency Department (HOSPITAL_COMMUNITY)
Admission: EM | Admit: 2019-06-22 | Discharge: 2019-06-22 | Disposition: A | Discharge status: Home / Self Care | Payer: Medicare Other | Attending: Emergency Medicine | Admitting: Emergency Medicine

## 2019-06-22 ENCOUNTER — Encounter (HOSPITAL_COMMUNITY): Payer: Self-pay | Admitting: Emergency Medicine

## 2019-06-22 DIAGNOSIS — K5903 Drug induced constipation: Secondary | ICD-10-CM | POA: Diagnosis not present

## 2019-06-22 DIAGNOSIS — M791 Myalgia, unspecified site: Secondary | ICD-10-CM | POA: Insufficient documentation

## 2019-06-22 DIAGNOSIS — R300 Dysuria: Secondary | ICD-10-CM | POA: Diagnosis not present

## 2019-06-22 DIAGNOSIS — R39198 Other difficulties with micturition: Secondary | ICD-10-CM | POA: Diagnosis not present

## 2019-06-22 DIAGNOSIS — Z79899 Other long term (current) drug therapy: Secondary | ICD-10-CM | POA: Insufficient documentation

## 2019-06-22 DIAGNOSIS — K5900 Constipation, unspecified: Secondary | ICD-10-CM | POA: Diagnosis present

## 2019-06-22 LAB — URINALYSIS, ROUTINE W REFLEX MICROSCOPIC
Bilirubin Urine: NEGATIVE
Glucose, UA: NEGATIVE mg/dL
Hgb urine dipstick: NEGATIVE
Ketones, ur: NEGATIVE mg/dL
Leukocytes,Ua: NEGATIVE
Nitrite: NEGATIVE
Protein, ur: NEGATIVE mg/dL
Specific Gravity, Urine: 1.004 — ABNORMAL LOW (ref 1.005–1.030)
pH: 7 (ref 5.0–8.0)

## 2019-06-22 LAB — CBC WITH DIFFERENTIAL/PLATELET
Abs Immature Granulocytes: 0.01 10*3/uL (ref 0.00–0.07)
Basophils Absolute: 0 10*3/uL (ref 0.0–0.1)
Basophils Relative: 1 %
Eosinophils Absolute: 0.4 10*3/uL (ref 0.0–0.5)
Eosinophils Relative: 11 %
HCT: 42.4 % (ref 39.0–52.0)
Hemoglobin: 14.1 g/dL (ref 13.0–17.0)
Immature Granulocytes: 0 %
Lymphocytes Relative: 33 %
Lymphs Abs: 1.4 10*3/uL (ref 0.7–4.0)
MCH: 32.6 pg (ref 26.0–34.0)
MCHC: 33.3 g/dL (ref 30.0–36.0)
MCV: 97.9 fL (ref 80.0–100.0)
Monocytes Absolute: 0.4 10*3/uL (ref 0.1–1.0)
Monocytes Relative: 10 %
Neutro Abs: 1.9 10*3/uL (ref 1.7–7.7)
Neutrophils Relative %: 45 %
Platelets: 204 10*3/uL (ref 150–400)
RBC: 4.33 MIL/uL (ref 4.22–5.81)
RDW: 13.3 % (ref 11.5–15.5)
WBC: 4.1 10*3/uL (ref 4.0–10.5)
nRBC: 0 % (ref 0.0–0.2)

## 2019-06-22 LAB — COMPREHENSIVE METABOLIC PANEL
ALT: 12 U/L (ref 0–44)
AST: 23 U/L (ref 15–41)
Albumin: 3.8 g/dL (ref 3.5–5.0)
Alkaline Phosphatase: 56 U/L (ref 38–126)
Anion gap: 5 (ref 5–15)
BUN: 10 mg/dL (ref 8–23)
CO2: 30 mmol/L (ref 22–32)
Calcium: 8.9 mg/dL (ref 8.9–10.3)
Chloride: 105 mmol/L (ref 98–111)
Creatinine, Ser: 1.09 mg/dL (ref 0.61–1.24)
GFR calc Af Amer: >60 mL/min (ref >60)
GFR calc non Af Amer: >60 mL/min (ref >60)
Glucose, Bld: 100 mg/dL — ABNORMAL HIGH (ref 70–99)
Potassium: 4.1 mmol/L (ref 3.5–5.1)
Sodium: 140 mmol/L (ref 135–145)
Total Bilirubin: 1.3 mg/dL — ABNORMAL HIGH (ref 0.3–1.2)
Total Protein: 7.1 g/dL (ref 6.5–8.1)

## 2019-06-22 MED ORDER — TAMSULOSIN HCL 0.4 MG PO CAPS: 0.4000 mg | ORAL_CAPSULE | Freq: Every day | ORAL | 0 refills | Status: AC

## 2019-06-22 MED ORDER — POLYETHYLENE GLYCOL 3350 17 G PO PACK: 17.0000 g | PACK | Freq: Every day | ORAL | 0 refills | Status: AC

## 2019-06-22 MED ORDER — ACETAMINOPHEN 325 MG PO TABS
650.0000 mg | ORAL_TABLET | Freq: Once | ORAL | Status: AC
  Administered 2019-06-22: 650 mg via ORAL
  Filled 2019-06-22: qty 2

## 2019-06-22 MED ORDER — DOCUSATE SODIUM 100 MG PO CAPS: 100.0000 mg | ORAL_CAPSULE | Freq: Two times a day (BID) | ORAL | 0 refills | Status: DC

## 2019-06-22 MED ORDER — ACETAMINOPHEN 325 MG PO TABS: 650.0000 mg | ORAL_TABLET | Freq: Four times a day (QID) | ORAL | 0 refills | Status: AC | PRN

## 2019-06-22 NOTE — ED Provider Notes (Signed)
[facility]-EMERGENCY DEPT Provider Note   CSN: [id number] Arrival date & time: 06/22/19  [id number]    History   Chief Complaint Chief Complaint  Patient presents with  . Diarrhea    HPI Thomas Rich is a 83 y.o. male.     HPI  83 year old male presents with complaints of diffuse body pain, pain with urination, and a hard time having a bowel movement.  Has been going on a couple weeks.  The arthritis/joint pain has been going on for several months.  He has been seen here before.  He states the medicine he was given last time seem to help and chart review indicates this was prednisone and tramadol.  He tells me that he has been having a hard time having a bowel movement though no abdominal pain.  He also has a hard time urinating and it seems to be painful as well.  The joint pain is not new or different except that it is still painful.  Has not taken anything since he ran out of those other medicines.  Past Medical History:  Diagnosis Date  . Arthritis   . HOH (hard of hearing)     Patient Active Problem List   Diagnosis Date Noted  . BPH (benign prostatic hyperplasia) 04/26/2012  . Dizziness - light-headed 04/26/2012  . Sinusitis 04/26/2012  . UTI (lower urinary tract infection) 04/26/2012  . Hypotension 04/26/2012  . Back pain 04/26/2012  . Acute kidney injury (HCC) 04/26/2012  . Hypokalemia 04/26/2012    No past surgical history on file.      Home Medications    Prior to Admission medications   Medication Sig Start Date End Date Taking? Authorizing Provider  acetaminophen (TYLENOL) 325 MG tablet Take 2 tablets (650 mg total) by mouth every 6 (six) hours as needed. 06/22/19   Pricilla LovelessGoldston, Airyonna Franklyn, MD  amLODipine (NORVASC) 2.5 MG tablet Take 1 tablet (2.5 mg total) by mouth daily. Patient not taking: Reported on 10/30/2018 09/20/18   Mesner, Barbara CowerJason, MD  amLODipine (NORVASC) 5 MG tablet Take 1 tablet (5 mg total) by mouth daily. 04/18/19   Arby BarrettePfeiffer,  Marcy, MD  azithromycin (ZITHROMAX Z-PAK) 250 MG tablet 2 po day one, then 1 daily x 4 days Patient not taking: Reported on 11/23/2018 10/30/18   Geoffery Lyonselo, Douglas, MD  diclofenac sodium (VOLTAREN) 1 % GEL Apply 4 g topically 4 (four) times daily. 03/22/19   Ward, Chase PicketJaime Pilcher, PA-C  docusate sodium (COLACE) 100 MG capsule Take 1 capsule (100 mg total) by mouth every 12 (twelve) hours. 06/22/19   Pricilla LovelessGoldston, Jarion Hawthorne, MD  Lidocaine 4 % PTCH Apply 1 patch topically 2 (two) times daily. Patient not taking: Reported on 10/30/2018 09/20/18   Mesner, Barbara CowerJason, MD  meloxicam (MOBIC) 7.5 MG tablet Take 1 tablet (7.5 mg total) by mouth daily. Patient not taking: Reported on 10/30/2018 10/17/18   Jacalyn LefevreHaviland, Julie, MD  polyethylene glycol (MIRALAX / GLYCOLAX) 17 g packet Take 17 g by mouth daily. 06/22/19   Pricilla LovelessGoldston, Hannahmarie Asberry, MD  potassium chloride (K-DUR) 10 MEQ tablet Take 1 tablet (10 mEq total) by mouth daily. Patient not taking: Reported on 10/30/2018 10/17/18   Jacalyn LefevreHaviland, Julie, MD  predniSONE (DELTASONE) 10 MG tablet Take 2 tablets (20 mg total) by mouth 2 (two) times daily with a meal. 06/10/19   Geoffery Lyonselo, Douglas, MD  tamsulosin (FLOMAX) 0.4 MG CAPS capsule Take 1 capsule (0.4 mg total) by mouth daily. 06/22/19   Pricilla LovelessGoldston, Abdulaziz Toman, MD  traMADol (ULTRAM) 50 MG tablet  Take 1 tablet (50 mg total) by mouth every 6 (six) hours as needed. 06/10/19   Geoffery Lyonselo, Douglas, MD    Family History Family History  Family history unknown: Yes    Social History Social History   Tobacco Use  . Smoking status: Never Smoker  . Smokeless tobacco: Never Used  Substance Use Topics  . Alcohol use: No  . Drug use: No     Allergies   Augmentin [amoxicillin-pot clavulanate]   Review of Systems Review of Systems  Constitutional: Negative for fever.  Gastrointestinal: Positive for constipation. Negative for abdominal pain and vomiting.  Genitourinary: Positive for dysuria.  Musculoskeletal: Positive for arthralgias and neck pain.  All  other systems reviewed and are negative.    Physical Exam Updated Vital Signs BP (!) 159/96   Pulse 66   Temp 98.6 F (37 C) (Oral)   Resp 18   SpO2 93%   Physical Exam Vitals signs and nursing note reviewed.  Constitutional:      General: He is not in acute distress.    Appearance: He is well-developed. He is not ill-appearing or diaphoretic.  HENT:     Head: Normocephalic and atraumatic.     Right Ear: External ear normal.     Left Ear: External ear normal.     Nose: Nose normal.  Eyes:     General:        Right eye: No discharge.        Left eye: No discharge.  Neck:     Musculoskeletal: Normal range of motion and neck supple. No neck rigidity.  Cardiovascular:     Rate and Rhythm: Normal rate and regular rhythm.     Heart sounds: Normal heart sounds.  Pulmonary:     Effort: Pulmonary effort is normal.     Breath sounds: Normal breath sounds.  Abdominal:     Palpations: Abdomen is soft.     Tenderness: There is no abdominal tenderness.  Genitourinary:    Rectum: No external hemorrhoid or internal hemorrhoid. Normal anal tone.     Comments: Brown stool on DRE. No impaction or hard stool. No gross blood.  Musculoskeletal:     Comments: Patient has pain with ROM of bilaterally elbows and left knee. No effusions or skin color change  Skin:    General: Skin is warm and dry.  Neurological:     Mental Status: He is alert.  Psychiatric:        Mood and Affect: Mood is not anxious.      ED Treatments / Results  Labs (all labs ordered are listed, but only abnormal results are displayed) Labs Reviewed  COMPREHENSIVE METABOLIC PANEL - Abnormal; Notable for the following components:      Result Value   Glucose, Bld 100 (*)    Total Bilirubin 1.3 (*)    All other components within normal limits  URINALYSIS, ROUTINE W REFLEX MICROSCOPIC - Abnormal; Notable for the following components:   Color, Urine STRAW (*)    Specific Gravity, Urine 1.004 (*)    All other  components within normal limits  CBC WITH DIFFERENTIAL/PLATELET    EKG None  Radiology No results found.  Procedures Procedures (including critical care time)  Medications Ordered in ED Medications  acetaminophen (TYLENOL) tablet 650 mg (650 mg Oral Given 06/22/19 1113)     Initial Impression / Assessment and Plan / ED Course  I have reviewed the triage vital signs and the nursing notes.  Pertinent labs &  imaging results that were available during my care of the patient were reviewed by me and considered in my medical decision making (see chart for details).        Patient's constipation is probably drug-induced with the recent tramadol use.  I will prescribe him bowel regimen.  No fecal impaction and no abdominal pain or tenderness.  No vomiting.  As for his difficulty urinating, this was worked up with UA which is negative.  Post void residual was giving many very different numbers including 125 and then shortly thereafter 300.  Given this inconsistency with his age and difficulty urinating a Foley was placed but he only has about 150 cc in his back after he had peed.  Thus this does not seem like urinary retention.  Probably has some BPH given his age and I will place him on Flomax.  The Foley catheter will be removed.  The nurse notes that there is some blood which is probably posttraumatic from the catheter insertion.  Otherwise, he is having chronic arthritis pain but is ambulating fine in the emergency department.  No effusions seen.  No systemic signs or symptoms.  Final Clinical Impressions(s) / ED Diagnoses   Final diagnoses:  Drug-induced constipation  Difficulty urinating    ED Discharge Orders         Ordered    acetaminophen (TYLENOL) 325 MG tablet  Every 6 hours PRN     06/22/19 1313    tamsulosin (FLOMAX) 0.4 MG CAPS capsule  Daily     06/22/19 1313    polyethylene glycol (MIRALAX / GLYCOLAX) 17 g packet  Daily     06/22/19 1313    docusate sodium (COLACE)  100 MG capsule  Every 12 hours     06/22/19 1313           Sherwood Gambler, MD 06/22/19 1328

## 2019-06-22 NOTE — ED Triage Notes (Signed)
Pt c/o diarrhea for several days. And he adds that he has pains from fall from last time he was brought here by ambulance but cant remember when that was.

## 2019-06-22 NOTE — ED Notes (Signed)
Pt. Aware of giving a urine specimen. Pt. Has a urinal at the bedside. Will collect urine when pt. Voids. Nurse aware.

## 2019-06-22 NOTE — ED Notes (Signed)
Patient came out of room and called for this nurse. Patient states "I tried to pull It out, but I cant get it out and it hurts!" Patient motions to his foley catheter. This RN noted the urine draining in patient catheter tubing was newly pink with a few small clots. MD made aware. Patient Catheter discontinued per order.

## 2020-08-16 IMAGING — DX DG KNEE COMPLETE 4+V*R*
4 series · 4 of 4 positions shown · non-contrast
Comparison: Radiographs January 02, 2018.

CLINICAL DATA: Right knee pain after fall.

EXAM:
RIGHT KNEE - COMPLETE 4+ VIEW

[knee ap]
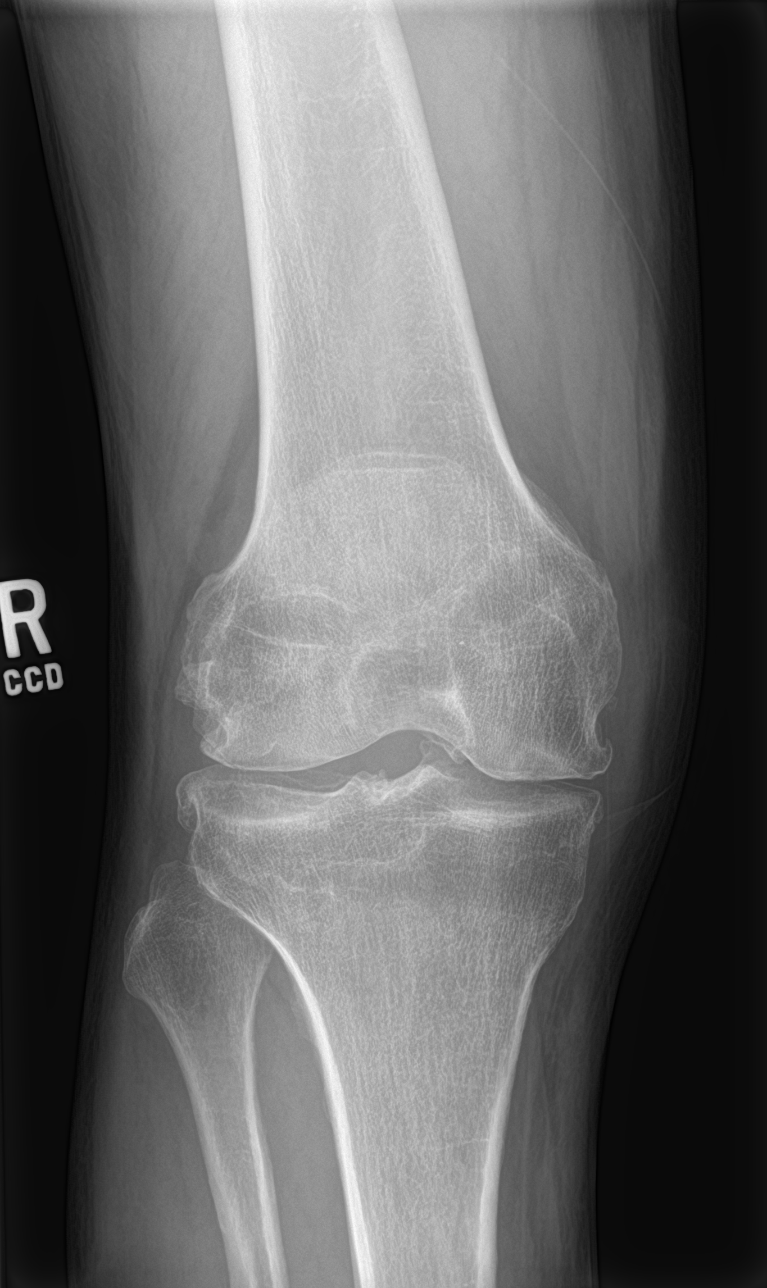

[knee lat]
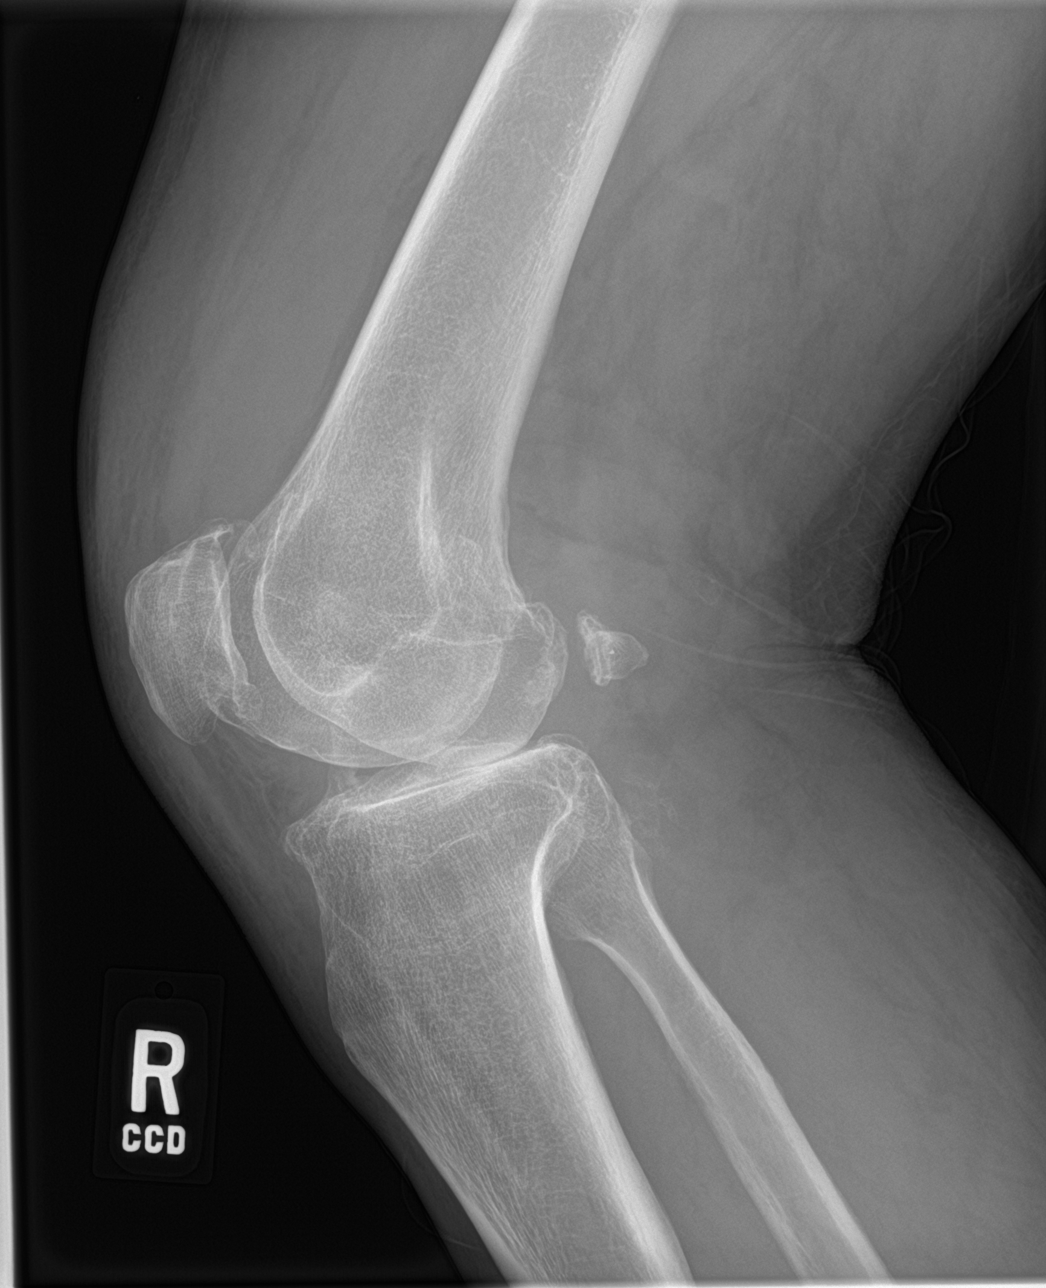

[knee obl (1 of 2)]
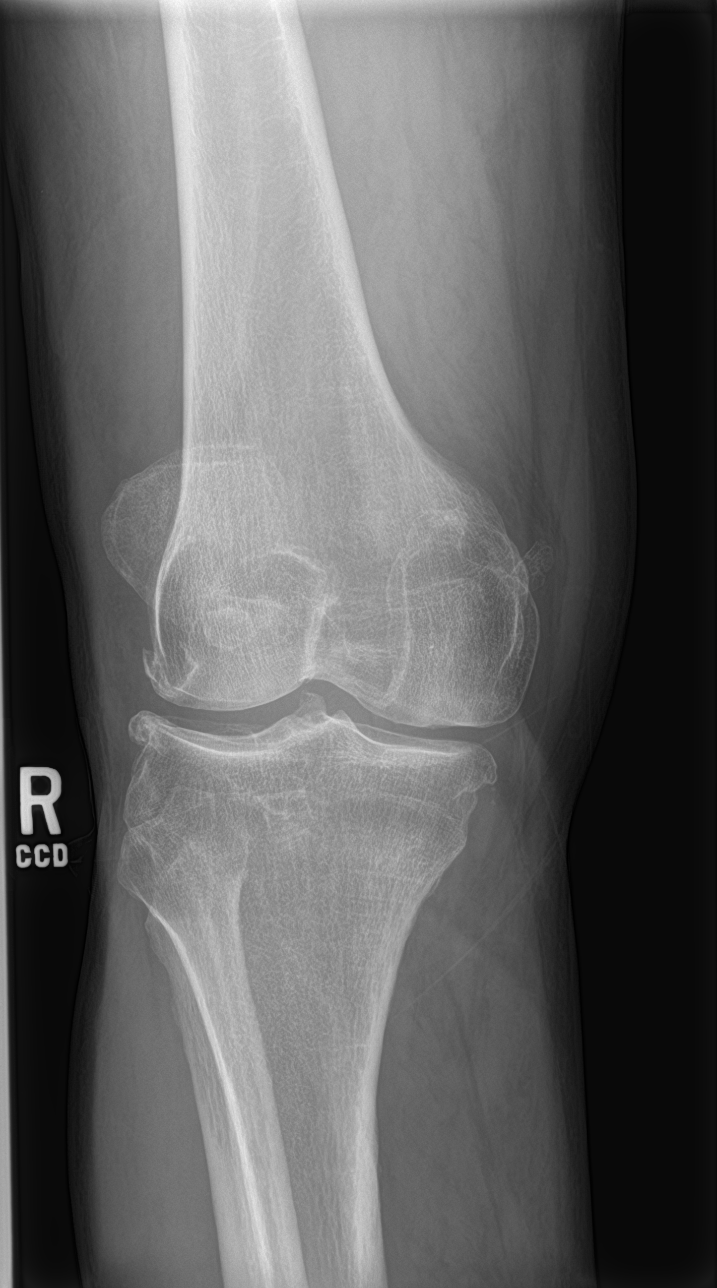

[knee obl (2 of 2)]
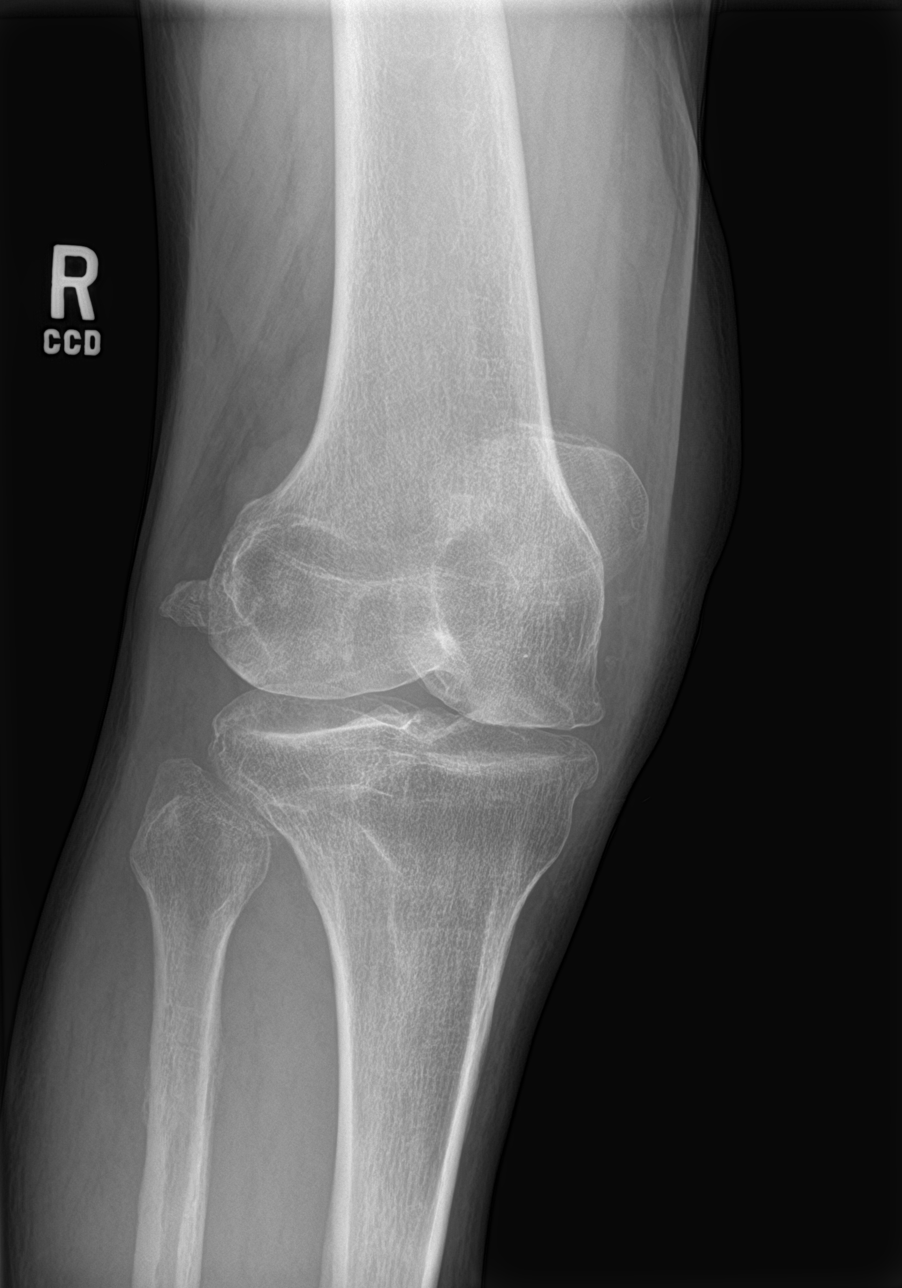

[4 of 4 positions shown; findings below may reference images not displayed]

FINDINGS: No fracture or dislocation is noted. Mild patellar spurring is
noted. Moderate size suprapatellar joint effusion is noted. Mild
narrowing of medial joint space is noted with osteophyte formation.
IMPRESSION: Moderate suprapatellar joint effusion. Mild degenerative joint
disease. No fracture or dislocation is noted.

## 2020-10-17 IMAGING — DX DG CHEST 2V
3 series · 3 of 3 positions shown · non-contrast
Comparison: 09/20/2018.

CLINICAL DATA: Cough.

EXAM:
CHEST - 2 VIEW

[chest lat]
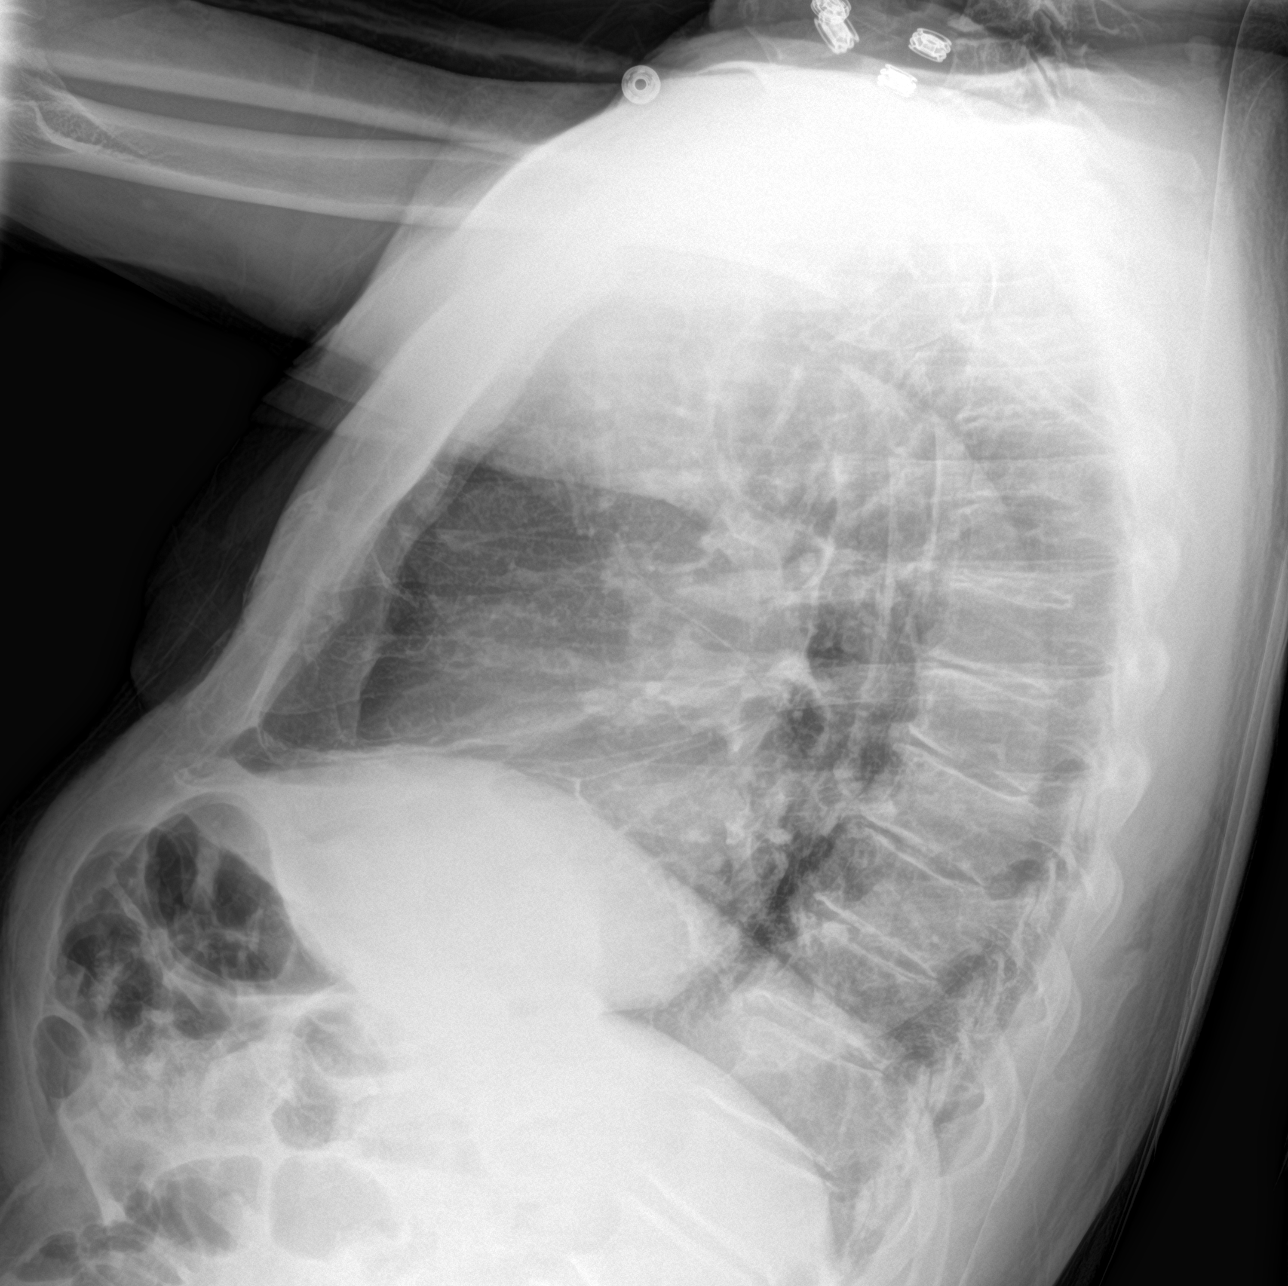

[chest ap (1 of 2)]
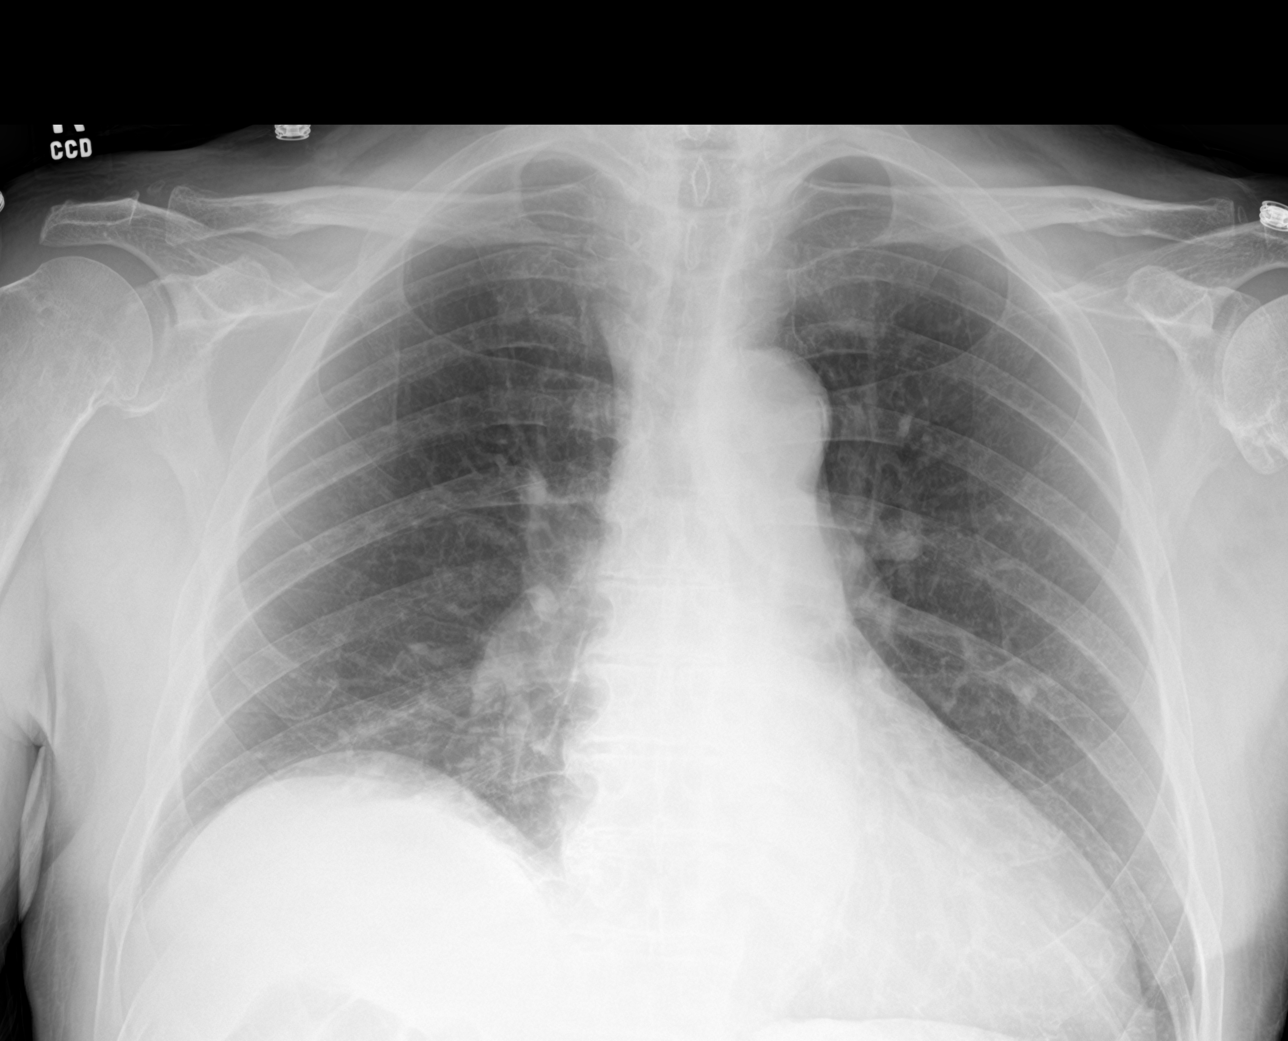

[chest ap (2 of 2)]
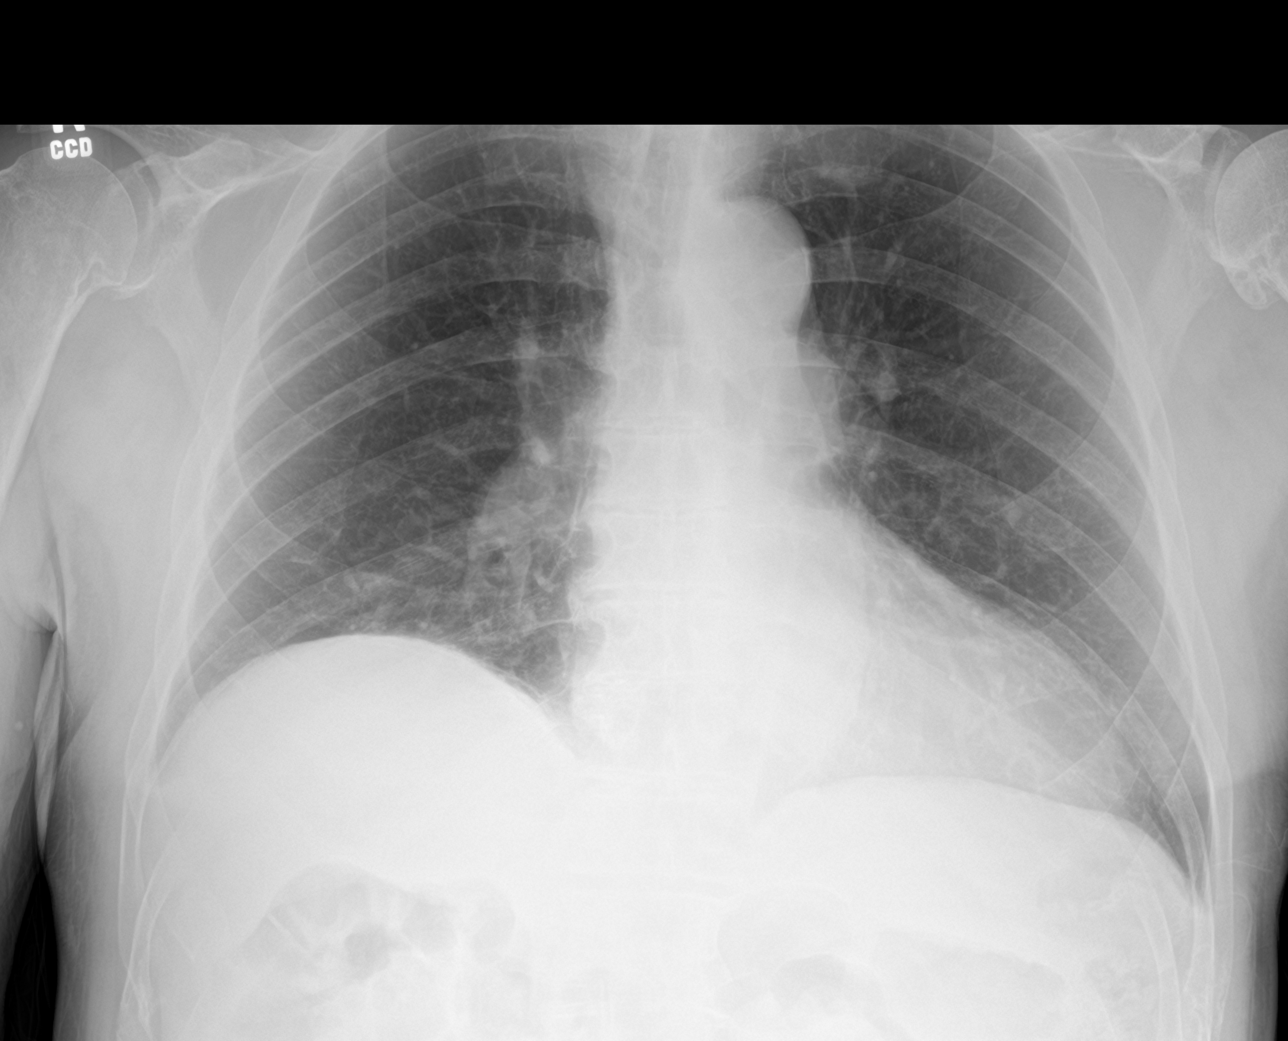

[3 of 3 positions shown; findings below may reference images not displayed]

FINDINGS: Mediastinum and hilar structures normal. Cardiomegaly with normal
pulmonary vascularity. Mild right base subsegmental atelectasis and
or scarring. No pleural effusion or pneumothorax.
IMPRESSION: Cardiomegaly with normal pulmonary vascularity. Mild right base
subsegmental atelectasis and or scarring.

## 2020-12-01 ENCOUNTER — Emergency Department (HOSPITAL_COMMUNITY)
Admission: EM | Admit: 2020-12-01 | Discharge: 2020-12-01 | Disposition: A | Discharge status: Home / Self Care | Payer: Medicare Other | Attending: Emergency Medicine | Admitting: Emergency Medicine

## 2020-12-01 ENCOUNTER — Encounter (HOSPITAL_COMMUNITY): Payer: Self-pay | Admitting: Obstetrics and Gynecology

## 2020-12-01 ENCOUNTER — Other Ambulatory Visit: Payer: Self-pay

## 2020-12-01 ENCOUNTER — Emergency Department (HOSPITAL_COMMUNITY): Payer: Medicare Other

## 2020-12-01 DIAGNOSIS — W010XXA Fall on same level from slipping, tripping and stumbling without subsequent striking against object, initial encounter: Secondary | ICD-10-CM | POA: Insufficient documentation

## 2020-12-01 DIAGNOSIS — Z5321 Procedure and treatment not carried out due to patient leaving prior to being seen by health care provider: Secondary | ICD-10-CM | POA: Diagnosis not present

## 2020-12-01 DIAGNOSIS — M25562 Pain in left knee: Secondary | ICD-10-CM | POA: Insufficient documentation

## 2020-12-01 DIAGNOSIS — M25561 Pain in right knee: Secondary | ICD-10-CM | POA: Diagnosis not present

## 2020-12-01 NOTE — ED Triage Notes (Signed)
Patient reports to the ER for bilateral knee pain. Patient reports multiple falls and that his left knee is starting to hurt worse.

## 2020-12-01 NOTE — ED Notes (Signed)
No answer from pt when called for triage x 1.  

## 2022-07-28 ENCOUNTER — Emergency Department (HOSPITAL_COMMUNITY): Payer: Medicare Other

## 2022-07-28 ENCOUNTER — Emergency Department (HOSPITAL_COMMUNITY)
Admission: EM | Admit: 2022-07-28 | Discharge: 2022-07-28 | Disposition: A | Discharge status: Home / Self Care | Payer: Medicare Other | Attending: Emergency Medicine | Admitting: Emergency Medicine

## 2022-07-28 DIAGNOSIS — W19XXXA Unspecified fall, initial encounter: Secondary | ICD-10-CM

## 2022-07-28 DIAGNOSIS — S7001XA Contusion of right hip, initial encounter: Secondary | ICD-10-CM | POA: Insufficient documentation

## 2022-07-28 DIAGNOSIS — S3992XA Unspecified injury of lower back, initial encounter: Secondary | ICD-10-CM | POA: Diagnosis present

## 2022-07-28 DIAGNOSIS — Y9389 Activity, other specified: Secondary | ICD-10-CM | POA: Insufficient documentation

## 2022-07-28 DIAGNOSIS — S39012A Strain of muscle, fascia and tendon of lower back, initial encounter: Secondary | ICD-10-CM | POA: Diagnosis not present

## 2022-07-28 DIAGNOSIS — I10 Essential (primary) hypertension: Secondary | ICD-10-CM | POA: Insufficient documentation

## 2022-07-28 NOTE — ED Triage Notes (Signed)
Pt BIBA, lost balance and fell while getting off the bus. No head trauma, no LOC. Denies neck pain, endorses back pain. Been out of home pain meds for a while. Been feeling weak and unsteady for some time. VSS

## 2022-07-28 NOTE — ED Notes (Signed)
Patient transported to X-ray 

## 2022-07-28 NOTE — ED Provider Notes (Signed)
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  Patient fell today and complains of lower back pain.  He also landed on his right hip but has minimal discomfort there  The history is provided by the patient and medical records. No language interpreter was used.  Fall This is a new problem. The current episode started 1 to 2 hours ago. The problem occurs rarely. The problem has been resolved. Pertinent negatives include no chest pain, no abdominal pain and no headaches. Nothing aggravates the symptoms. Nothing relieves the symptoms. He has tried nothing for the symptoms. The treatment provided no relief.       Home Medications Prior to Admission medications   Medication Sig Start Date End Date Taking? Authorizing Provider  acetaminophen (TYLENOL) 325 MG tablet Take 2 tablets (650 mg total) by mouth every 6 (six) hours as needed. 06/22/19   Pricilla Loveless, MD  amLODipine (NORVASC) 2.5 MG tablet Take 1 tablet (2.5 mg total) by mouth daily. Patient not taking: Reported on 10/30/2018 09/20/18   Mesner, Barbara Cower, MD  amLODipine (NORVASC) 5 MG tablet Take 1 tablet (5 mg total) by mouth daily. 04/18/19   Arby Barrette, MD  azithromycin (ZITHROMAX Z-PAK) 250 MG tablet 2 po day one, then 1 daily x 4 days Patient not taking: Reported on 11/23/2018 10/30/18   Geoffery Lyons, MD  diclofenac sodium (VOLTAREN) 1 % GEL Apply 4 g topically 4 (four) times daily. 03/22/19   Ward, Chase Picket, PA-C  docusate sodium (COLACE) 100 MG capsule Take 1 capsule (100 mg total) by mouth every 12 (twelve) hours. 06/22/19   Pricilla Loveless, MD  Lidocaine 4 % PTCH Apply 1 patch topically 2 (two) times daily. Patient not taking: Reported on 10/30/2018 09/20/18   Mesner, Barbara Cower, MD  meloxicam (MOBIC) 7.5 MG tablet Take 1 tablet (7.5 mg total) by mouth  daily. Patient not taking: Reported on 10/30/2018 10/17/18   Jacalyn Lefevre, MD  polyethylene glycol (MIRALAX / GLYCOLAX) 17 g packet Take 17 g by mouth daily. 06/22/19   Pricilla Loveless, MD  potassium chloride (K-DUR) 10 MEQ tablet Take 1 tablet (10 mEq total) by mouth daily. Patient not taking: Reported on 10/30/2018 10/17/18   Jacalyn Lefevre, MD  predniSONE (DELTASONE) 10 MG tablet Take 2 tablets (20 mg total) by mouth 2 (two) times daily with a meal. 06/10/19   Geoffery Lyons, MD  tamsulosin (FLOMAX) 0.4 MG CAPS capsule Take 1 capsule (0.4 mg total) by mouth daily. 06/22/19   Pricilla Loveless, MD  traMADol (ULTRAM) 50 MG tablet Take 1 tablet (50 mg total) by mouth every 6 (six) hours as needed. 06/10/19   Geoffery Lyons, MD      Allergies    Augmentin [amoxicillin-pot clavulanate]    Review of Systems   Review of Systems  Constitutional:  Negative for appetite change and fatigue.  HENT:  Negative for congestion, ear discharge and sinus pressure.   Eyes:  Negative for discharge.  Respiratory:  Negative for cough.   Cardiovascular:  Negative for chest pain.  Gastrointestinal:  Negative for abdominal pain and diarrhea.  Genitourinary:  Negative for frequency and hematuria.  Musculoskeletal:  Negative for back pain.       Back pain  Skin:  Negative for rash.  Neurological:  Negative for seizures and headaches.  Psychiatric/Behavioral:  Negative for hallucinations.     Physical Exam Updated Vital Signs BP (!) 99/53   Pulse 68   Temp 98.7 F (37.1 C) (Oral)   Resp 15   Ht 5\' 8"  (1.727 m)   Wt 72.6 kg   SpO2 99%   BMI 24.33 kg/m  Physical Exam Vitals and nursing note reviewed.  Constitutional:      Appearance: He is well-developed.  HENT:     Head: Normocephalic.     Nose: Nose normal.  Eyes:     General: No scleral icterus.    Conjunctiva/sclera: Conjunctivae normal.  Neck:     Thyroid: No thyromegaly.  Cardiovascular:     Rate and Rhythm: Normal rate and regular rhythm.      Heart sounds: No murmur heard.    No friction rub. No gallop.  Pulmonary:     Breath sounds: No stridor. No wheezing or rales.  Chest:     Chest wall: No tenderness.  Abdominal:     General: There is no distension.     Tenderness: There is no abdominal tenderness. There is no rebound.  Musculoskeletal:        General: Normal range of motion.     Cervical back: Neck supple.     Comments: Tender lumbar spine minor  Lymphadenopathy:     Cervical: No cervical adenopathy.  Skin:    Findings: No erythema or rash.  Neurological:     Mental Status: He is oriented to person, place, and time.     Motor: No abnormal muscle tone.     Coordination: Coordination normal.  Psychiatric:        Behavior: Behavior normal.     ED Results / Procedures / Treatments   Labs (all labs ordered are listed, but only abnormal results are displayed) Labs Reviewed - No data to display  EKG None  Radiology DG Lumbar Spine Complete  Result Date: 07/28/2022 CLINICAL DATA:  Fall. Loss of balance while getting off the bus. Endorses back pain EXAM: LUMBAR SPINE - COMPLETE 4+ VIEW COMPARISON:  04/26/2012 FINDINGS: Unchanged appearance of 6 mm anterolisthesis of L5 on S1. Mild to moderate vacuum disc noted at L2-3. Bilateral lower lumbar facet arthropathy noted. Chronic pars defects are again noted at the L5 level. Multilevel disc space narrowing and endplate spurring is noted throughout the visualized portions of the lower thoracic and lumbar spine. No signs of acute fracture or dislocation. IMPRESSION: 1. No acute findings. 2. Stable appearance of 6 mm anterolisthesis of L5 on S1 secondary to chronic bilateral pars defects. 3. Multilevel lumbar degenerative disc disease and facet arthropathy. Electronically Signed   By: 06/26/2012 M.D.   On: 07/28/2022 11:20   DG Hip Unilat W or Wo Pelvis 2-3 Views Right  Result Date: 07/28/2022 CLINICAL DATA:  Status post fall, denies neck pain EXAM: DG HIP (WITH OR  WITHOUT PELVIS) 2-3V RIGHT COMPARISON:  None Available. FINDINGS: No acute fracture or dislocation. No aggressive osseous lesion. Normal alignment. Generalized osteopenia. Soft tissue are unremarkable. No radiopaque foreign body or soft tissue emphysema. IMPRESSION: 1. No acute osseous injury of the right hip. Given the patient's age and osteopenia, if there is persistent clinical concern for an occult hip fracture, a MRI of the hip is recommended for increased sensitivity. Electronically Signed   By: 09/27/2022 M.D.   On: 07/28/2022 11:19    Procedures Procedures    Medications Ordered in ED Medications - No data to display  ED Course/ Medical Decision Making/ A&P                           Medical Decision Making Amount and/or Complexity of Data Reviewed Radiology: ordered.  This patient presents to the ED for concern of fall, this involves an extensive number of treatment options, and is a complaint that carries with it a high risk of complications and morbidity.  The differential diagnosis includes contusion lower spine.  Compression fracture.   Co morbidities that complicate the patient evaluation  Hypertension   Additional history obtained:  Additional history obtained from patient External records from outside source obtained and reviewed including hospital records   Lab Tests:  I Ordered, and personally interpreted labs.  The pertinent results include: No labs   Imaging Studies ordered:  I ordered imaging studies including right hip and lumbar spine I independently visualized and interpreted imaging which showed no fractures I agree with the radiologist interpretation   Cardiac Monitoring: / EKG:  The patient was maintained on a cardiac monitor.  I personally viewed and interpreted the cardiac monitored which showed an underlying rhythm of: Normal sinus rhythm   Consultations Obtained:  No consultant  Problem List / ED Course / Critical interventions /  Medication management  Fall with contusion to lumbar spine I ordered medication including no medicines given Reevaluation of the patient after these medicines showed that the patient stayed the same I have reviewed the patients home medicines and have made adjustments as needed   Social Determinants of Health:  None   Test / Admission - Considered:  None  Patient with lumbar strain and minimal hip contusion.  He will follow-up as needed and take Tylenol        Final Clinical Impression(s) / ED Diagnoses Final diagnoses:  Fall, initial encounter    Rx / DC Orders ED Discharge Orders     None         Bethann Berkshire, MD 07/30/22 1121

## 2022-07-28 NOTE — Discharge Instructions (Signed)
Take Tylenol for pain.  Follow-up if not improving

## 2022-08-15 ENCOUNTER — Inpatient Hospital Stay (HOSPITAL_COMMUNITY)
Admission: EM | Admit: 2022-08-15 | Discharge: 2022-08-18 | DRG: 884 | Disposition: A | Discharge status: Skilled Nursing Facility | Payer: Medicare Other | Attending: Internal Medicine | Admitting: Internal Medicine

## 2022-08-15 ENCOUNTER — Emergency Department (HOSPITAL_COMMUNITY): Payer: Medicare Other

## 2022-08-15 DIAGNOSIS — H9190 Unspecified hearing loss, unspecified ear: Secondary | ICD-10-CM | POA: Diagnosis present

## 2022-08-15 DIAGNOSIS — Z7952 Long term (current) use of systemic steroids: Secondary | ICD-10-CM | POA: Diagnosis not present

## 2022-08-15 DIAGNOSIS — Z880 Allergy status to penicillin: Secondary | ICD-10-CM | POA: Diagnosis not present

## 2022-08-15 DIAGNOSIS — F0390 Unspecified dementia without behavioral disturbance: Principal | ICD-10-CM | POA: Diagnosis present

## 2022-08-15 DIAGNOSIS — Z79899 Other long term (current) drug therapy: Secondary | ICD-10-CM

## 2022-08-15 DIAGNOSIS — F05 Delirium due to known physiological condition: Secondary | ICD-10-CM | POA: Diagnosis present

## 2022-08-15 DIAGNOSIS — E875 Hyperkalemia: Secondary | ICD-10-CM | POA: Diagnosis present

## 2022-08-15 DIAGNOSIS — I10 Essential (primary) hypertension: Secondary | ICD-10-CM | POA: Diagnosis present

## 2022-08-15 DIAGNOSIS — E0781 Sick-euthyroid syndrome: Secondary | ICD-10-CM | POA: Diagnosis present

## 2022-08-15 DIAGNOSIS — N400 Enlarged prostate without lower urinary tract symptoms: Secondary | ICD-10-CM | POA: Diagnosis present

## 2022-08-15 DIAGNOSIS — R4189 Other symptoms and signs involving cognitive functions and awareness: Secondary | ICD-10-CM

## 2022-08-15 DIAGNOSIS — R001 Bradycardia, unspecified: Secondary | ICD-10-CM | POA: Diagnosis present

## 2022-08-15 DIAGNOSIS — Z791 Long term (current) use of non-steroidal anti-inflammatories (NSAID): Secondary | ICD-10-CM

## 2022-08-15 DIAGNOSIS — D61818 Other pancytopenia: Secondary | ICD-10-CM | POA: Diagnosis present

## 2022-08-15 DIAGNOSIS — I444 Left anterior fascicular block: Secondary | ICD-10-CM | POA: Diagnosis present

## 2022-08-15 DIAGNOSIS — Z7189 Other specified counseling: Secondary | ICD-10-CM | POA: Diagnosis not present

## 2022-08-15 DIAGNOSIS — Z66 Do not resuscitate: Secondary | ICD-10-CM | POA: Diagnosis not present

## 2022-08-15 DIAGNOSIS — R5381 Other malaise: Secondary | ICD-10-CM | POA: Diagnosis not present

## 2022-08-15 DIAGNOSIS — E860 Dehydration: Secondary | ICD-10-CM | POA: Diagnosis present

## 2022-08-15 DIAGNOSIS — R9431 Abnormal electrocardiogram [ECG] [EKG]: Secondary | ICD-10-CM | POA: Diagnosis not present

## 2022-08-15 DIAGNOSIS — R4182 Altered mental status, unspecified: Secondary | ICD-10-CM | POA: Diagnosis not present

## 2022-08-15 DIAGNOSIS — G9341 Metabolic encephalopathy: Secondary | ICD-10-CM | POA: Diagnosis present

## 2022-08-15 DIAGNOSIS — M1990 Unspecified osteoarthritis, unspecified site: Secondary | ICD-10-CM | POA: Diagnosis present

## 2022-08-15 LAB — I-STAT CHEM 8, ED
BUN: 26 mg/dL — ABNORMAL HIGH (ref 8–23)
Calcium, Ion: 1 mmol/L — ABNORMAL LOW (ref 1.15–1.40)
Chloride: 99 mmol/L (ref 98–111)
Creatinine, Ser: 1 mg/dL (ref 0.61–1.24)
Glucose, Bld: 88 mg/dL (ref 70–99)
HCT: 34 % — ABNORMAL LOW (ref 39.0–52.0)
Hemoglobin: 11.6 g/dL — ABNORMAL LOW (ref 13.0–17.0)
Potassium: 5.4 mmol/L — ABNORMAL HIGH (ref 3.5–5.1)
Sodium: 135 mmol/L (ref 135–145)
TCO2: 30 mmol/L (ref 22–32)

## 2022-08-15 LAB — CBC WITH DIFFERENTIAL/PLATELET
Abs Immature Granulocytes: 0.01 10*3/uL (ref 0.00–0.07)
Basophils Absolute: 0 10*3/uL (ref 0.0–0.1)
Basophils Relative: 1 %
Eosinophils Absolute: 0.1 10*3/uL (ref 0.0–0.5)
Eosinophils Relative: 4 %
HCT: 31.4 % — ABNORMAL LOW (ref 39.0–52.0)
Hemoglobin: 11.1 g/dL — ABNORMAL LOW (ref 13.0–17.0)
Immature Granulocytes: 0 %
Lymphocytes Relative: 31 %
Lymphs Abs: 0.9 10*3/uL (ref 0.7–4.0)
MCH: 42.4 pg — ABNORMAL HIGH (ref 26.0–34.0)
MCHC: 35.4 g/dL (ref 30.0–36.0)
MCV: 119.8 fL — ABNORMAL HIGH (ref 80.0–100.0)
Monocytes Absolute: 0.2 10*3/uL (ref 0.1–1.0)
Monocytes Relative: 8 %
Neutro Abs: 1.7 10*3/uL (ref 1.7–7.7)
Neutrophils Relative %: 56 %
Platelets: 52 10*3/uL — ABNORMAL LOW (ref 150–400)
RBC: 2.62 MIL/uL — ABNORMAL LOW (ref 4.22–5.81)
RDW: 13.8 % (ref 11.5–15.5)
Smear Review: DECREASED
WBC: 3 10*3/uL — ABNORMAL LOW (ref 4.0–10.5)
nRBC: 0 % (ref 0.0–0.2)

## 2022-08-15 LAB — COMPREHENSIVE METABOLIC PANEL
ALT: 18 U/L (ref 0–44)
AST: 34 U/L (ref 15–41)
Albumin: 3.3 g/dL — ABNORMAL LOW (ref 3.5–5.0)
Alkaline Phosphatase: 44 U/L (ref 38–126)
Anion gap: 10 (ref 5–15)
BUN: 18 mg/dL (ref 8–23)
CO2: 26 mmol/L (ref 22–32)
Calcium: 8.6 mg/dL — ABNORMAL LOW (ref 8.9–10.3)
Chloride: 99 mmol/L (ref 98–111)
Creatinine, Ser: 1.01 mg/dL (ref 0.61–1.24)
GFR, Estimated: >60 mL/min (ref >60)
Glucose, Bld: 94 mg/dL (ref 70–99)
Potassium: 4.5 mmol/L (ref 3.5–5.1)
Sodium: 135 mmol/L (ref 135–145)
Total Bilirubin: 3 mg/dL — ABNORMAL HIGH (ref 0.3–1.2)
Total Protein: 6.4 g/dL — ABNORMAL LOW (ref 6.5–8.1)

## 2022-08-15 LAB — LACTIC ACID, PLASMA: Lactic Acid, Venous: 1.2 mmol/L (ref 0.5–1.9)

## 2022-08-15 MED ORDER — ACETAMINOPHEN 325 MG PO TABS: 650.0000 mg | ORAL_TABLET | Freq: Four times a day (QID) | ORAL | Status: DC | PRN

## 2022-08-15 MED ORDER — POLYETHYLENE GLYCOL 3350 17 G PO PACK: 17.0000 g | PACK | Freq: Every day | ORAL | Status: DC | PRN

## 2022-08-15 MED ORDER — SODIUM CHLORIDE 0.9 % IV SOLN: INTRAVENOUS | Status: AC

## 2022-08-15 MED ORDER — ONDANSETRON HCL 4 MG/2ML IJ SOLN: 4.0000 mg | Freq: Four times a day (QID) | INTRAMUSCULAR | Status: DC | PRN

## 2022-08-15 MED ORDER — LORAZEPAM 2 MG/ML IJ SOLN
1.0000 mg | Freq: Once | INTRAMUSCULAR | Status: AC
  Administered 2022-08-15: 1 mg via INTRAVENOUS
  Filled 2022-08-15: qty 1

## 2022-08-15 MED ORDER — MELATONIN 5 MG PO TABS: 5.0000 mg | ORAL_TABLET | Freq: Every evening | ORAL | Status: DC | PRN

## 2022-08-15 NOTE — ED Triage Notes (Signed)
Pt BIB EMS from boarding housing d/t being unresponsive by landlord. Upon EMS arrival patient was found to be bradycardic in the 40's. Patient is confused but baseline is unknown; possible dementia.   VSS w/ EMS besides continuous bradycardia.

## 2022-08-15 NOTE — H&P (Signed)
History and Physical  Thomas Rich GFQ:[id number] DOB: July 26, 1936 DOA: 08/15/2022  Referring physician: Dr. Fredderick Phenix, EDP  PCP: Patient, No Pcp Per  Outpatient Specialists: None Patient coming from: Home  Chief Complaint: Unresponsive   HPI: Thomas Rich is a 86 y.o. male with medical history significant for hypertension, BPH, bradycardia in 2019, who presented to [facility] ED via EMS after being found unresponsive in his boarding room by his landlord.  Upon EMS arrival, they were able to get him to respond.  He was noted to be bradycardic with heart rate in the 40s.  ECG showed sinus bradycardia.  He was brought into the ED for further evaluation.  Seen by cardiology, ongoing work-up for bradycardia.  [facility], hospitalist service, was asked to admit.    At the time of this visit, the patient is somnolent and not cooperative with the exam.  ED Course: Tmax 97.7.  BP 105/66, pulse 87, respiration rate 16, O2 saturation 100% on room air.  Lab studies remarkable for serum potassium 5.4, T. bilirubin 3.0.  WBC 3.0, hemoglobin 11.6, platelet count 52.  Review of Systems: Review of systems as noted in the HPI. All other systems reviewed and are negative.   Past Medical History:  Diagnosis Date   Arthritis    HOH (hard of hearing)    No past surgical history on file.  Social History:  reports that he has never smoked. He has never used smokeless tobacco. He reports that he does not drink alcohol and does not use drugs.   Allergies  Allergen Reactions   Augmentin [Amoxicillin-Pot Clavulanate] Rash    Has patient had a PCN reaction causing immediate rash, facial/tongue/throat swelling, SOB or lightheadedness with hypotension: unknown Has patient had a PCN reaction causing severe rash involving mucus membranes or skin necrosis: unknown Has patient had a PCN reaction that required hospitalization unknown Has patient had a PCN reaction occurring within the last 10 years: unknown If all  of the above answers are "NO", then may proceed with Cephalosporin use.     Family History  Family history unknown: Yes     Prior to Admission medications   Medication Sig Start Date End Date Taking? Authorizing Provider  acetaminophen (TYLENOL) 325 MG tablet Take 2 tablets (650 mg total) by mouth every 6 (six) hours as needed. 06/22/19   Pricilla Loveless, MD  amLODipine (NORVASC) 2.5 MG tablet Take 1 tablet (2.5 mg total) by mouth daily. Patient not taking: Reported on 10/30/2018 09/20/18   Mesner, Barbara Cower, MD  amLODipine (NORVASC) 5 MG tablet Take 1 tablet (5 mg total) by mouth daily. 04/18/19   Arby Barrette, MD  azithromycin (ZITHROMAX Z-PAK) 250 MG tablet 2 po day one, then 1 daily x 4 days Patient not taking: Reported on 11/23/2018 10/30/18   Geoffery Lyons, MD  diclofenac sodium (VOLTAREN) 1 % GEL Apply 4 g topically 4 (four) times daily. 03/22/19   Ward, Chase Picket, PA-C  docusate sodium (COLACE) 100 MG capsule Take 1 capsule (100 mg total) by mouth every 12 (twelve) hours. 06/22/19   Pricilla Loveless, MD  Lidocaine 4 % PTCH Apply 1 patch topically 2 (two) times daily. Patient not taking: Reported on 10/30/2018 09/20/18   Mesner, Barbara Cower, MD  meloxicam (MOBIC) 7.5 MG tablet Take 1 tablet (7.5 mg total) by mouth daily. Patient not taking: Reported on 10/30/2018 10/17/18   Jacalyn Lefevre, MD  polyethylene glycol (MIRALAX / GLYCOLAX) 17 g packet Take 17 g by mouth daily. 06/22/19   Pricilla Loveless, MD  potassium chloride (K-DUR) 10 MEQ tablet Take 1 tablet (10 mEq total) by mouth daily. Patient not taking: Reported on 10/30/2018 10/17/18   Jacalyn Lefevre, MD  predniSONE (DELTASONE) 10 MG tablet Take 2 tablets (20 mg total) by mouth 2 (two) times daily with a meal. 06/10/19   Geoffery Lyons, MD  tamsulosin (FLOMAX) 0.4 MG CAPS capsule Take 1 capsule (0.4 mg total) by mouth daily. 06/22/19   Pricilla Loveless, MD  traMADol (ULTRAM) 50 MG tablet Take 1 tablet (50 mg total) by mouth every 6 (six) hours  as needed. 06/10/19   Geoffery Lyons, MD    Physical Exam: BP 111/64 (BP Location: Right Arm)   Pulse (!) 48   Temp 97.6 F (36.4 C) (Oral)   Resp 14   SpO2 98%   General: 86 y.o. year-old male well developed well nourished in no acute distress.  Somnolent, not cooperative with exam.   Cardiovascular: Bradycardic with no rubs or gallops.  No thyromegaly or JVD noted.  No lower extremity edema bilaterally. Respiratory: Clear to auscultation with no wheezes or rales.  Poor inspiratory effort. Abdomen: Soft nontender nondistended with normal bowel sounds x4 quadrants. Muskuloskeletal: No cyanosis, clubbing or edema noted bilaterally Neuro: CN II-XII intact, strength, sensation, reflexes Skin: No ulcerative lesions noted or rashes Psychiatry: Unable to assess judgment and mood due to somnolence.         Labs on Admission:  Basic Metabolic Panel: Recent Labs  Lab 08/15/22 1557 08/15/22 1621  NA 135 135  K 4.5 5.4*  CL 99 99  CO2 26  --   GLUCOSE 94 88  BUN 18 26*  CREATININE 1.01 1.00  CALCIUM 8.6*  --    Liver Function Tests: Recent Labs  Lab 08/15/22 1557  AST 34  ALT 18  ALKPHOS 44  BILITOT 3.0*  PROT 6.4*  ALBUMIN 3.3*   No results for input(s): "LIPASE", "AMYLASE" in the last 168 hours. No results for input(s): "AMMONIA" in the last 168 hours. CBC: Recent Labs  Lab 08/15/22 1557 08/15/22 1621  WBC 3.0*  --   NEUTROABS 1.7  --   HGB 11.1* 11.6*  HCT 31.4* 34.0*  MCV 119.8*  --   PLT 52*  --    Cardiac Enzymes: No results for input(s): "CKTOTAL", "CKMB", "CKMBINDEX", "TROPONINI" in the last 168 hours.  BNP (last 3 results) No results for input(s): "BNP" in the last 8760 hours.  ProBNP (last 3 results) No results for input(s): "PROBNP" in the last 8760 hours.  CBG: No results for input(s): "GLUCAP" in the last 168 hours.  Radiological Exams on Admission: CT Head Wo Contrast  Result Date: 08/15/2022 CLINICAL DATA:  Mental status change after being  found unresponsive by landlord EXAM: CT HEAD WITHOUT CONTRAST TECHNIQUE: Contiguous axial images were obtained from the base of the skull through the vertex without intravenous contrast. RADIATION DOSE REDUCTION: This exam was performed according to the departmental dose-optimization program which includes automated exposure control, adjustment of the mA and/or kV according to patient size and/or use of iterative reconstruction technique. COMPARISON:  CT head 03/14/2018 FINDINGS: Brain: No intracranial hemorrhage, mass effect, or evidence of acute infarct. No hydrocephalus. No extra-axial fluid collection. Generalized cerebral atrophy. Ill-defined hypoattenuation within the cerebral white matter is nonspecific but consistent with chronic small vessel ischemic disease. Vascular: No hyperdense vessel. Intracranial arterial calcification. Skull: No fracture or focal lesion. Sinuses/Orbits: No acute finding. Trace air-fluid level in the right maxillary sinus, decreased from prior. Other: None. IMPRESSION:  No acute intracranial abnormality. Age-related atrophy and chronic microvascular ischemic change. Electronically Signed   By: Placido Sou M.D.   On: 08/15/2022 20:06   DG Chest Port 1 View  Result Date: 08/15/2022 CLINICAL DATA:  Altered mental status EXAM: PORTABLE CHEST 1 VIEW COMPARISON:  11/23/2018 FINDINGS: Low lung volumes. No acute airspace disease or pleural effusion. Borderline cardiac enlargement. Aortic atherosclerosis. No pneumothorax. IMPRESSION: No active disease. Low lung volumes. Chronic mild elevation of right diaphragm Electronically Signed   By: Donavan Foil M.D.   On: 08/15/2022 17:51    EKG: I independently viewed the EKG done and my findings are as followed: Sinus bradycardia rate of 49.  Nonspecific ST-T changes.  QTc 451.  Assessment/Plan Present on Admission:  Unresponsiveness  Principal Problem:   Unresponsiveness  Unresponsiveness, unclear etiology CT head non acute In  sinus bradycardia Monitor on telemetry Follow 2D echo Obtain orthostatic vital signs when able Fall precautions  Sinus bradycardia Unclear etiology Sinus bradycardia appears chronic, since 2019 Obtain TSH and free T4 Seen by cardiology, appreciate recommendations  Acute metabolic encephalopathy, likely multifactorial secondary to dehydration, acute illness. Unclear baseline mentation Altered mental status Reorient as needed Fall precautions  Hyperkalemia Serum potassium 5.4 Repeat serum potassium level, treat if elevated Repeat BMP in the morning  Pancytopenia, unclear etiology Repeat CBC in the morning  Physical debility PT OT to assess Fall precautions TOC consulted to assist with DC planning    Critical care time, 65 minutes.    DVT prophylaxis: Not on pharmacological DVT prophylaxis due to thrombocytopenia  Code Status: Full code  Family Communication: None at bedside  Disposition Plan: Admitted to telemetry cardiac unit  Consults called: None  Admission status: Inpatient status.   Status is: Inpatient The patient requires at least 2 midnights for further evaluation and treatment of present condition.   Kayleen Memos MD [facility] Pager [phone]  If 7PM-7AM, please contact night-coverage [email or web address] Password [facility]  08/15/2022, 8:48 PM

## 2022-08-15 NOTE — ED Notes (Signed)
This RN asked mini lab to collect blood cultures and labs.

## 2022-08-15 NOTE — ED Provider Notes (Signed)
[facility] EMERGENCY DEPARTMENT Provider Note   CSN: [id number] Arrival date & time: 08/15/22  1502     History  Chief Complaint  Patient presents with   Bradycardia    Thomas Rich is a 86 y.o. male.  Patient is a 86 year old male who presents with bradycardia and altered mental status.  He reports that he does not have any past medical history although per chart review it looks like he was previously on antihypertensive medication.  He says he currently does not take any medications.  He was found to be living in a boardinghouse on upper floor.  His living conditions were poor per EMS report.  Initially the landlord found him to be unresponsive.  On EMS arrival, they were able to get him to respond and ultimately was conversive.  He is oriented to person and place but confused to time.  No current complaints.  He denies any chest pain or shortness of breath.  He Nuys any weakness or dizziness.  He was noted to be bradycardic with heart rate in the 40s.  His blood pressure initially was a little soft but improved with some IV fluids.  His blood glucose by EMS was normal.       Home Medications Prior to Admission medications   Medication Sig Start Date End Date Taking? Authorizing Provider  acetaminophen (TYLENOL) 325 MG tablet Take 2 tablets (650 mg total) by mouth every 6 (six) hours as needed. 06/22/19   Sherwood Gambler, MD  amLODipine (NORVASC) 2.5 MG tablet Take 1 tablet (2.5 mg total) by mouth daily. Patient not taking: Reported on 10/30/2018 09/20/18   Mesner, Corene Cornea, MD  amLODipine (NORVASC) 5 MG tablet Take 1 tablet (5 mg total) by mouth daily. 04/18/19   Charlesetta Shanks, MD  azithromycin (ZITHROMAX Z-PAK) 250 MG tablet 2 po day one, then 1 daily x 4 days Patient not taking: Reported on 11/23/2018 10/30/18   Veryl Speak, MD  diclofenac sodium (VOLTAREN) 1 % GEL Apply 4 g topically 4 (four) times daily. 03/22/19   Ward, Ozella Almond, PA-C  docusate  sodium (COLACE) 100 MG capsule Take 1 capsule (100 mg total) by mouth every 12 (twelve) hours. 06/22/19   Sherwood Gambler, MD  Lidocaine 4 % PTCH Apply 1 patch topically 2 (two) times daily. Patient not taking: Reported on 10/30/2018 09/20/18   Mesner, Corene Cornea, MD  meloxicam (MOBIC) 7.5 MG tablet Take 1 tablet (7.5 mg total) by mouth daily. Patient not taking: Reported on 10/30/2018 10/17/18   Isla Pence, MD  polyethylene glycol (MIRALAX / GLYCOLAX) 17 g packet Take 17 g by mouth daily. 06/22/19   Sherwood Gambler, MD  potassium chloride (K-DUR) 10 MEQ tablet Take 1 tablet (10 mEq total) by mouth daily. Patient not taking: Reported on 10/30/2018 10/17/18   Isla Pence, MD  predniSONE (DELTASONE) 10 MG tablet Take 2 tablets (20 mg total) by mouth 2 (two) times daily with a meal. 06/10/19   Veryl Speak, MD  tamsulosin (FLOMAX) 0.4 MG CAPS capsule Take 1 capsule (0.4 mg total) by mouth daily. 06/22/19   Sherwood Gambler, MD  traMADol (ULTRAM) 50 MG tablet Take 1 tablet (50 mg total) by mouth every 6 (six) hours as needed. 06/10/19   Veryl Speak, MD      Allergies    Augmentin [amoxicillin-pot clavulanate]    Review of Systems   Review of Systems  Unable to perform ROS: Mental status change    Physical Exam Updated Vital Signs BP 111/64 (  BP Location: Right Arm)   Pulse (!) 48   Temp 97.6 F (36.4 C) (Oral)   Resp 14   SpO2 98%  Physical Exam Constitutional:      Appearance: He is well-developed.  HENT:     Head: Normocephalic and atraumatic.  Eyes:     Pupils: Pupils are equal, round, and reactive to light.  Cardiovascular:     Rate and Rhythm: Regular rhythm. Bradycardia present.     Heart sounds: Normal heart sounds.  Pulmonary:     Effort: Pulmonary effort is normal. No respiratory distress.     Breath sounds: Normal breath sounds. No wheezing or rales.  Chest:     Chest wall: No tenderness.  Abdominal:     General: Bowel sounds are normal.     Palpations: Abdomen is  soft.     Tenderness: There is no abdominal tenderness. There is no guarding or rebound.  Musculoskeletal:        General: Normal range of motion.     Cervical back: Normal range of motion and neck supple.     Comments: Fingertips are dusky  Lymphadenopathy:     Cervical: No cervical adenopathy.  Skin:    General: Skin is warm and dry.     Findings: No rash.  Neurological:     Mental Status: He is alert.     Comments: Oriented to person and place but confused to month and year, he is able to move all extremities symmetrically without focal deficits.  No obvious facial drooping.  He will follow commands but is slow to follow some commands.     ED Results / Procedures / Treatments   Labs (all labs ordered are listed, but only abnormal results are displayed) Labs Reviewed  COMPREHENSIVE METABOLIC PANEL - Abnormal; Notable for the following components:      Result Value   Calcium 8.6 (*)    Total Protein 6.4 (*)    Albumin 3.3 (*)    Total Bilirubin 3.0 (*)    All other components within normal limits  CBC WITH DIFFERENTIAL/PLATELET - Abnormal; Notable for the following components:   WBC 3.0 (*)    RBC 2.62 (*)    Hemoglobin 11.1 (*)    HCT 31.4 (*)    MCV 119.8 (*)    MCH 42.4 (*)    Platelets 52 (*)    All other components within normal limits  I-STAT CHEM 8, ED - Abnormal; Notable for the following components:   Potassium 5.4 (*)    BUN 26 (*)    Calcium, Ion 1.00 (*)    Hemoglobin 11.6 (*)    HCT 34.0 (*)    All other components within normal limits  CULTURE, BLOOD (ROUTINE X 2)  CULTURE, BLOOD (ROUTINE X 2)  LACTIC ACID, PLASMA  URINALYSIS, ROUTINE W REFLEX MICROSCOPIC  POTASSIUM  CBC WITH DIFFERENTIAL/PLATELET  COMPREHENSIVE METABOLIC PANEL  MAGNESIUM  PHOSPHORUS    EKG EKG Interpretation  Date/Time:  Sunday August 15 2022 15:44:30 EDT Ventricular Rate:  49 PR Interval:  188 QRS Duration: 102 QT Interval:  499 QTC Calculation: 451 R  Axis:   -74 Text Interpretation: Sinus bradycardia Left anterior fascicular block Low voltage, extremity leads Confirmed by Rolan Bucco [phone]) on 08/15/2022 3:56:25 PM  Radiology CT Head Wo Contrast  Result Date: 08/15/2022 CLINICAL DATA:  Mental status change after being found unresponsive by landlord EXAM: CT HEAD WITHOUT CONTRAST TECHNIQUE: Contiguous axial images were obtained from the base of the  skull through the vertex without intravenous contrast. RADIATION DOSE REDUCTION: This exam was performed according to the departmental dose-optimization program which includes automated exposure control, adjustment of the mA and/or kV according to patient size and/or use of iterative reconstruction technique. COMPARISON:  CT head 03/14/2018 FINDINGS: Brain: No intracranial hemorrhage, mass effect, or evidence of acute infarct. No hydrocephalus. No extra-axial fluid collection. Generalized cerebral atrophy. Ill-defined hypoattenuation within the cerebral white matter is nonspecific but consistent with chronic small vessel ischemic disease. Vascular: No hyperdense vessel. Intracranial arterial calcification. Skull: No fracture or focal lesion. Sinuses/Orbits: No acute finding. Trace air-fluid level in the right maxillary sinus, decreased from prior. Other: None. IMPRESSION: No acute intracranial abnormality. Age-related atrophy and chronic microvascular ischemic change. Electronically Signed   By: Minerva Fester M.D.   On: 08/15/2022 20:06   DG Chest Port 1 View  Result Date: 08/15/2022 CLINICAL DATA:  Altered mental status EXAM: PORTABLE CHEST 1 VIEW COMPARISON:  11/23/2018 FINDINGS: Low lung volumes. No acute airspace disease or pleural effusion. Borderline cardiac enlargement. Aortic atherosclerosis. No pneumothorax. IMPRESSION: No active disease. Low lung volumes. Chronic mild elevation of right diaphragm Electronically Signed   By: Jasmine Pang M.D.   On: 08/15/2022 17:51    Procedures Procedures     Medications Ordered in ED Medications  0.9 %  sodium chloride infusion (has no administration in time range)  acetaminophen (TYLENOL) tablet 650 mg (has no administration in time range)  ondansetron (ZOFRAN) injection 4 mg (has no administration in time range)  melatonin tablet 5 mg (has no administration in time range)  polyethylene glycol (MIRALAX / GLYCOLAX) packet 17 g (has no administration in time range)  LORazepam (ATIVAN) injection 1 mg (1 mg Intravenous Given 08/15/22 1929)    ED Course/ Medical Decision Making/ A&P                           Medical Decision Making Amount and/or Complexity of Data Reviewed Labs: ordered. Radiology: ordered.  Risk Prescription drug management. Decision regarding hospitalization.   Patient is a 86 year old who presents with episode of unresponsiveness with a heart rate of 40.  Here he is awake and alert.  He does have some confusion.  I suspect he has a component of dementia.  On chart review, there was a note from 2019 where dementia was questioned.  He does not have any focal neurologic deficits.  No fever or other suggestions of infection.  His labs show some pancytopenia on CBC but otherwise nonrevealing.  His kidney function is okay.  His electrolytes are okay.  He had a head CT which shows no acute abnormality.  No bleeding or suggestions of a stroke/tumor.  Chest x-ray was performed which is interpreted by me and confirmed by the radiologist to show no evidence of pneumonia.  No pulmonary edema.  His vital signs of been stable other than the bradycardia.  His blood pressures been okay.  His EKG shows sinus bradycardia.  I spoke with Dr. Margo Aye who will admit the patient for further treatment.  I also consulted with Dr. Anne Hahn with cardiology who will see the patient.  Final Clinical Impression(s) / ED Diagnoses Final diagnoses:  Bradycardia  Acute confusional state    Rx / DC Orders ED Discharge Orders     None         Rolan Bucco, MD 08/15/22 2119

## 2022-08-15 NOTE — Consult Note (Signed)
Cardiology Consultation   Patient ID: Wilberto Console MRN: [id number]; DOB: December 31, 1935  Admit date: 08/15/2022 Date of Consult: 08/15/2022  PCP:  Patient, No Pcp Per   [facility] Providers Cardiologist:  None        Patient Profile:   Jacek Colson is a 86 y.o. male with a hx of HTN, BPH and bradycardia who presented to the ED on 9/24 after being found unresponsive in her boarding house by his landlord. Upon arrival to the ED, pt bradycardic to 48 with ECG c/w sinus bradycardia. Cardiology consulted for assistance.   History of Present Illness:   Benjaman Artman is a 86 y.o. male with a hx of bradycardia who presented to the ED on 9/24 after being found unresponsive in her boarding house by his landlord. On EMS arrival, they were able to get him to respond and ultimately was conversive.   Upon arrival to the ED, pt bradycardic to 48 with ECG c/w sinus bradycardia. Labs notable for new pancytopenia with WBC 3.0, Hgb 11.1 and Plt 52. CMP with concerns for malnutrition with low TP and Alb. LA 1.2. CXR stable. CTH stable. Pt was started on NS 75/hr and Cardiology consulted for assistance.   Upon evaluation, pt is unarrousable to voice or sternal rub and thus further information regarding ros could not be obtained at this time.   Past Medical History:  Diagnosis Date   Arthritis    HOH (hard of hearing)     No past surgical history on file.   Home Medications:  Prior to Admission medications   Medication Sig Start Date End Date Taking? Authorizing Provider  acetaminophen (TYLENOL) 325 MG tablet Take 2 tablets (650 mg total) by mouth every 6 (six) hours as needed. 06/22/19   Pricilla Loveless, MD  amLODipine (NORVASC) 2.5 MG tablet Take 1 tablet (2.5 mg total) by mouth daily. Patient not taking: Reported on 10/30/2018 09/20/18   Mesner, Barbara Cower, MD  amLODipine (NORVASC) 5 MG tablet Take 1 tablet (5 mg total) by mouth daily. 04/18/19   Arby Barrette, MD   azithromycin (ZITHROMAX Z-PAK) 250 MG tablet 2 po day one, then 1 daily x 4 days Patient not taking: Reported on 11/23/2018 10/30/18   Geoffery Lyons, MD  diclofenac sodium (VOLTAREN) 1 % GEL Apply 4 g topically 4 (four) times daily. 03/22/19   Ward, Chase Picket, PA-C  docusate sodium (COLACE) 100 MG capsule Take 1 capsule (100 mg total) by mouth every 12 (twelve) hours. 06/22/19   Pricilla Loveless, MD  Lidocaine 4 % PTCH Apply 1 patch topically 2 (two) times daily. Patient not taking: Reported on 10/30/2018 09/20/18   Mesner, Barbara Cower, MD  meloxicam (MOBIC) 7.5 MG tablet Take 1 tablet (7.5 mg total) by mouth daily. Patient not taking: Reported on 10/30/2018 10/17/18   Jacalyn Lefevre, MD  polyethylene glycol (MIRALAX / GLYCOLAX) 17 g packet Take 17 g by mouth daily. 06/22/19   Pricilla Loveless, MD  potassium chloride (K-DUR) 10 MEQ tablet Take 1 tablet (10 mEq total) by mouth daily. Patient not taking: Reported on 10/30/2018 10/17/18   Jacalyn Lefevre, MD  predniSONE (DELTASONE) 10 MG tablet Take 2 tablets (20 mg total) by mouth 2 (two) times daily with a meal. 06/10/19   Geoffery Lyons, MD  tamsulosin (FLOMAX) 0.4 MG CAPS capsule Take 1 capsule (0.4 mg total) by mouth daily. 06/22/19   Pricilla Loveless, MD  traMADol (ULTRAM) 50 MG tablet Take 1 tablet (50 mg total) by mouth every 6 (six) hours  as needed. 06/10/19   Geoffery Lyons, MD    Inpatient Medications: Scheduled Meds:  Continuous Infusions:  sodium chloride 75 mL/hr at 08/15/22 2129   PRN Meds: acetaminophen, melatonin, ondansetron (ZOFRAN) IV, polyethylene glycol  Allergies:    Allergies  Allergen Reactions   Augmentin [Amoxicillin-Pot Clavulanate] Rash    Has patient had a PCN reaction causing immediate rash, facial/tongue/throat swelling, SOB or lightheadedness with hypotension: unknown Has patient had a PCN reaction causing severe rash involving mucus membranes or skin necrosis: unknown Has patient had a PCN reaction that required  hospitalization unknown Has patient had a PCN reaction occurring within the last 10 years: unknown If all of the above answers are "NO", then may proceed with Cephalosporin use.     Social History:   Social History   Socioeconomic History   Marital status: Single    Spouse name: Not on file   Number of children: Not on file   Years of education: Not on file   Highest education level: Not on file  Occupational History   Not on file  Tobacco Use   Smoking status: Never   Smokeless tobacco: Never  Vaping Use   Vaping Use: Never used  Substance and Sexual Activity   Alcohol use: No   Drug use: No   Sexual activity: Never  Other Topics Concern   Not on file  Social History Narrative   Not on file   Social Determinants of Health   Financial Resource Strain: Not on file  Food Insecurity: Not on file  Transportation Needs: Not on file  Physical Activity: Not on file  Stress: Not on file  Social Connections: Not on file  Intimate Partner Violence: Not on file    Family History:    Family History  Family history unknown: Yes     ROS:  Please see the history of present illness.  14 point ROS was performed. All other ROS reviewed and negative.     Physical Exam/Data:   Vitals:   08/15/22 2000 08/15/22 2015 08/15/22 2025 08/15/22 2030  BP: 110/75 126/81  111/64  Pulse: (!) 56 66  (!) 48  Resp: 20 17  14   Temp:   97.6 F (36.4 C)   TempSrc:   Oral   SpO2: 90% 100%  98%   No intake or output data in the 24 hours ending 08/15/22 2209    07/28/2022   10:10 AM 09/20/2018    9:21 AM 08/16/2018   10:45 AM  Last 3 Weights  Weight (lbs) 160 lb 186 lb 191 lb 5.8 oz  Weight (kg) 72.576 kg 84.369 kg 86.8 kg     There is no height or weight on file to calculate BMI.  General:  Undernourished  HEENT: normal Neck: no JVD Vascular: No carotid bruits; Distal pulses 2+ bilaterally Cardiac:  normal S1, S2; RRR; no murmur  Lungs:  clear to auscultation bilaterally, no  wheezing, rhonchi or rales  Abd: soft, nontender, no hepatomegaly  Ext: no edema Musculoskeletal:  No deformities, BUE and BLE strength normal and equal Skin: warm and dry  Neuro/Psych:  no focal deficits noted, somnolent; unable to arouse to voice or sternal rub   EKG:  The EKG was personally reviewed and demonstrates:  sinus bradycardia Telemetry:  Telemetry was personally reviewed and demonstrates:  sinus bradycardia   Relevant CV Studies: ECHO pending  Laboratory Data:  High Sensitivity Troponin:  No results for input(s): "TROPONINIHS" in the last 720 hours.   Chemistry  Recent Labs  Lab 08/15/22 1557 08/15/22 1621  NA 135 135  K 4.5 5.4*  CL 99 99  CO2 26  --   GLUCOSE 94 88  BUN 18 26*  CREATININE 1.01 1.00  CALCIUM 8.6*  --   GFRNONAA >60  --   ANIONGAP 10  --     Recent Labs  Lab 08/15/22 1557  PROT 6.4*  ALBUMIN 3.3*  AST 34  ALT 18  ALKPHOS 44  BILITOT 3.0*   Lipids No results for input(s): "CHOL", "TRIG", "HDL", "LABVLDL", "LDLCALC", "CHOLHDL" in the last 168 hours.  Hematology Recent Labs  Lab 08/15/22 1557 08/15/22 1621  WBC 3.0*  --   RBC 2.62*  --   HGB 11.1* 11.6*  HCT 31.4* 34.0*  MCV 119.8*  --   MCH 42.4*  --   MCHC 35.4  --   RDW 13.8  --   PLT 52*  --    Thyroid No results for input(s): "TSH", "FREET4" in the last 168 hours.  BNPNo results for input(s): "BNP", "PROBNP" in the last 168 hours.  DDimer No results for input(s): "DDIMER" in the last 168 hours.   Radiology/Studies:  CT Head Wo Contrast  Result Date: 08/15/2022 CLINICAL DATA:  Mental status change after being found unresponsive by landlord EXAM: CT HEAD WITHOUT CONTRAST TECHNIQUE: Contiguous axial images were obtained from the base of the skull through the vertex without intravenous contrast. RADIATION DOSE REDUCTION: This exam was performed according to the departmental dose-optimization program which includes automated exposure control, adjustment of the mA and/or kV  according to patient size and/or use of iterative reconstruction technique. COMPARISON:  CT head 03/14/2018 FINDINGS: Brain: No intracranial hemorrhage, mass effect, or evidence of acute infarct. No hydrocephalus. No extra-axial fluid collection. Generalized cerebral atrophy. Ill-defined hypoattenuation within the cerebral white matter is nonspecific but consistent with chronic small vessel ischemic disease. Vascular: No hyperdense vessel. Intracranial arterial calcification. Skull: No fracture or focal lesion. Sinuses/Orbits: No acute finding. Trace air-fluid level in the right maxillary sinus, decreased from prior. Other: None. IMPRESSION: No acute intracranial abnormality. Age-related atrophy and chronic microvascular ischemic change. Electronically Signed   By: Placido Sou M.D.   On: 08/15/2022 20:06   DG Chest Port 1 View  Result Date: 08/15/2022 CLINICAL DATA:  Altered mental status EXAM: PORTABLE CHEST 1 VIEW COMPARISON:  11/23/2018 FINDINGS: Low lung volumes. No acute airspace disease or pleural effusion. Borderline cardiac enlargement. Aortic atherosclerosis. No pneumothorax. IMPRESSION: No active disease. Low lung volumes. Chronic mild elevation of right diaphragm Electronically Signed   By: Donavan Foil M.D.   On: 08/15/2022 17:51     Assessment and Plan:   Acute Metabolic Encephalopathy Sinus bradycardia - pt brady to 48 on arrival; ECG notable for sinus bradycardia - upon chart review, pt with bradycardia to the 40s-50s dating back to 2019 - would evaluate for reversible causes of bradycardia - ensure no CCB toxicity (Amlodipine on home med list) - r/o other medication toxicity - order UDS - r/o infectious etiology (temp low on admission and WBC 3.0) - BC x 2 pending - r/o hematologic process (new pancytopenia on CBC) - order peripheral blood smear - r/o HIV in the s/o pancytopenia as well  - r/o nutritional deficiency (Alb and TP low on arrival) - pt currently on NS 75/hr -  order Vit B1, B12, Folate, 25-OH D  - r/o ischemic process - please order hstroponin; echo pending  - r/o thyroid do - order TSH; free  T3/T4  Risk Assessment/Risk Scores:  Sinus bradycardia No ACS No CHF No AF      For questions or updates, please contact [facility] Please consult [email or web address] for contact info under    Signed, Crosby Oyster, MD  08/15/2022 10:09 PM

## 2022-08-15 NOTE — ED Notes (Signed)
Neighbor Mattie Marlin [phone] would like an update asap (he has no family only neighbors)

## 2022-08-16 ENCOUNTER — Inpatient Hospital Stay (HOSPITAL_COMMUNITY): Payer: Medicare Other

## 2022-08-16 DIAGNOSIS — R001 Bradycardia, unspecified: Secondary | ICD-10-CM

## 2022-08-16 DIAGNOSIS — R9431 Abnormal electrocardiogram [ECG] [EKG]: Secondary | ICD-10-CM | POA: Diagnosis not present

## 2022-08-16 DIAGNOSIS — F05 Delirium due to known physiological condition: Secondary | ICD-10-CM | POA: Diagnosis not present

## 2022-08-16 DIAGNOSIS — R5381 Other malaise: Secondary | ICD-10-CM

## 2022-08-16 DIAGNOSIS — E875 Hyperkalemia: Secondary | ICD-10-CM

## 2022-08-16 DIAGNOSIS — G9341 Metabolic encephalopathy: Secondary | ICD-10-CM

## 2022-08-16 DIAGNOSIS — R4189 Other symptoms and signs involving cognitive functions and awareness: Secondary | ICD-10-CM | POA: Diagnosis not present

## 2022-08-16 DIAGNOSIS — Z7189 Other specified counseling: Secondary | ICD-10-CM

## 2022-08-16 DIAGNOSIS — D61818 Other pancytopenia: Secondary | ICD-10-CM

## 2022-08-16 LAB — CBC WITH DIFFERENTIAL/PLATELET
Abs Immature Granulocytes: 0.01 10*3/uL (ref 0.00–0.07)
Basophils Absolute: 0 10*3/uL (ref 0.0–0.1)
Basophils Relative: 1 %
Eosinophils Absolute: 0.2 10*3/uL (ref 0.0–0.5)
Eosinophils Relative: 5 %
HCT: 30.9 % — ABNORMAL LOW (ref 39.0–52.0)
Hemoglobin: 10.6 g/dL — ABNORMAL LOW (ref 13.0–17.0)
Immature Granulocytes: 0 %
Lymphocytes Relative: 36 %
Lymphs Abs: 1.2 10*3/uL (ref 0.7–4.0)
MCH: 42.1 pg — ABNORMAL HIGH (ref 26.0–34.0)
MCHC: 34.3 g/dL (ref 30.0–36.0)
MCV: 122.6 fL — ABNORMAL HIGH (ref 80.0–100.0)
Monocytes Absolute: 0.2 10*3/uL (ref 0.1–1.0)
Monocytes Relative: 7 %
Neutro Abs: 1.7 10*3/uL (ref 1.7–7.7)
Neutrophils Relative %: 51 %
Platelets: 50 10*3/uL — ABNORMAL LOW (ref 150–400)
RBC: 2.52 MIL/uL — ABNORMAL LOW (ref 4.22–5.81)
RDW: 13.7 % (ref 11.5–15.5)
WBC: 3.3 10*3/uL — ABNORMAL LOW (ref 4.0–10.5)
nRBC: 0 % (ref 0.0–0.2)

## 2022-08-16 LAB — COMPREHENSIVE METABOLIC PANEL
ALT: 19 U/L (ref 0–44)
AST: 26 U/L (ref 15–41)
Albumin: 3.2 g/dL — ABNORMAL LOW (ref 3.5–5.0)
Alkaline Phosphatase: 42 U/L (ref 38–126)
Anion gap: 13 (ref 5–15)
BUN: 17 mg/dL (ref 8–23)
CO2: 25 mmol/L (ref 22–32)
Calcium: 8.7 mg/dL — ABNORMAL LOW (ref 8.9–10.3)
Chloride: 100 mmol/L (ref 98–111)
Creatinine, Ser: 0.96 mg/dL (ref 0.61–1.24)
GFR, Estimated: >60 mL/min (ref >60)
Glucose, Bld: 85 mg/dL (ref 70–99)
Potassium: 3.5 mmol/L (ref 3.5–5.1)
Sodium: 138 mmol/L (ref 135–145)
Total Bilirubin: 2.9 mg/dL — ABNORMAL HIGH (ref 0.3–1.2)
Total Protein: 6.4 g/dL — ABNORMAL LOW (ref 6.5–8.1)

## 2022-08-16 LAB — MAGNESIUM: Magnesium: 1.7 mg/dL (ref 1.7–2.4)

## 2022-08-16 LAB — TSH: TSH: 5.195 u[IU]/mL — ABNORMAL HIGH (ref 0.350–4.500)

## 2022-08-16 LAB — PHOSPHORUS: Phosphorus: 3.3 mg/dL (ref 2.5–4.6)

## 2022-08-16 LAB — T4, FREE: Free T4: 1.15 ng/dL — ABNORMAL HIGH (ref 0.61–1.12)

## 2022-08-16 LAB — ECHOCARDIOGRAM COMPLETE
Area-P 1/2: 3.36 cm2
S' Lateral: 2.2 cm

## 2022-08-16 MED ORDER — FOLIC ACID 1 MG PO TABS
1.0000 mg | ORAL_TABLET | Freq: Every day | ORAL | Status: DC
  Administered 2022-08-17 – 2022-08-18 (×2): 1 mg via ORAL
  Filled 2022-08-16 (×2): qty 1

## 2022-08-16 MED ORDER — THIAMINE HCL 100 MG/ML IJ SOLN
500.0000 mg | Freq: Every day | INTRAVENOUS | Status: AC
  Administered 2022-08-16 – 2022-08-18 (×3): 500 mg via INTRAVENOUS
  Filled 2022-08-16 (×3): qty 5

## 2022-08-16 NOTE — Progress Notes (Addendum)
PROGRESS NOTE    Thomas Rich  QMV:[id number] DOB: 1936/07/05 DOA: 08/15/2022 PCP: Patient, No Pcp Per   Brief Narrative:  86 year old male with history of hypertension, BPH, bradycardia and was on 19 presented after being found unresponsive in his boarding home by his landlord.  On presentation, he was still very somnolent with heart rate in the 40s and EKG showing sinus bradycardia.  Cardiology was consulted.  He was started on IV fluids.  CT of the head was negative for any acute abnormality.  Assessment & Plan:   Unresponsiveness/acute metabolic encephalopathy -Questionable cause.  CT of the head was negative for acute abnormality. -We will check MRI of brain.  EEG.  If mental status does not improve, will request neurology evaluation -PT/OT/SLP evaluation.  Fall precaution.  Monitor mental status and do frequent neurochecks -Check vitamin B12, ammonia levels in AM.  LFTs normal -will start high-dose thiamine -Check urinalysis  Sinus bradycardia -Cardiology following.  Follow recommendations.  Pancytopenia -Questionable cause.  Platelets 52 on presentation and 50 today.  Repeat a.m. labs.  No signs of bleeding.  Might need hematology evaluation as an outpatient  Abnormal thyroid function test -TSH and T4 both elevated.  Will need repeat labs as an outpatient in 6 weeks time  Hyperkalemia -Resolved  Physical debility -PT/OT eval  Goals of care -Patient looks chronically deconditioned and very disheveled.  Consult palliative care for goals of care discussion.    DVT prophylaxis: SCDs Code Status: Full Family Communication: None at bedside Disposition Plan: Status is: Inpatient Remains inpatient appropriate because: Of severity of illness  Consultants: Cardiology.  Consult palliative care. Procedures: None  Antimicrobials: None   Subjective: Patient seen and examined at bedside.  Awake but extremely slow to respond.  Hardly answers any questions.  No  seizures or agitation reported.  Objective: Vitals:   08/16/22 0400 08/16/22 0415 08/16/22 0430 08/16/22 0700  BP: (!) 144/89 114/80 119/73 121/88  Pulse: (!) 57 (!) 51 (!) 53 64  Resp: 14 15 14 12   Temp:    (!) 97.5 F (36.4 C)  TempSrc:    Oral  SpO2: 100% 100% 100% 100%   No intake or output data in the 24 hours ending 08/16/22 0809 There were no vitals filed for this visit.  Examination:  General exam: No distress.  Looks chronically ill and deconditioned.  Currently on room air.  Looks extremely disheveled. Respiratory system: Bilateral decreased breath sounds at bases with some scattered crackles Cardiovascular system: S1 & S2 heard, bradycardic gastrointestinal system: Abdomen is nondistended, soft and nontender. Normal bowel sounds heard. Extremities: No cyanosis, clubbing, edema  Central nervous system: Awake but hardly answers any questions.  No focal neurological deficits. Moving extremities Skin: No rashes, lesions or ulcers Psychiatry: Extremely flat affect.  Hardly parts which in any conversation.  No signs of agitation.   Data Reviewed: I have personally reviewed following labs and imaging studies  CBC: Recent Labs  Lab 08/15/22 1557 08/15/22 1621 08/16/22 0226  WBC 3.0*  --  3.3*  NEUTROABS 1.7  --  1.7  HGB 11.1* 11.6* 10.6*  HCT 31.4* 34.0* 30.9*  MCV 119.8*  --  122.6*  PLT 52*  --  50*   Basic Metabolic Panel: Recent Labs  Lab 08/15/22 1557 08/15/22 1621 08/16/22 0226  NA 135 135 138  K 4.5 5.4* 3.5  CL 99 99 100  CO2 26  --  25  GLUCOSE 94 88 85  BUN 18 26* 17  CREATININE 1.01  1.00 0.96  CALCIUM 8.6*  --  8.7*  MG  --   --  1.7  PHOS  --   --  3.3   GFR: CrCl cannot be calculated (Unknown ideal weight.). Liver Function Tests: Recent Labs  Lab 08/15/22 1557 08/16/22 0226  AST 34 26  ALT 18 19  ALKPHOS 44 42  BILITOT 3.0* 2.9*  PROT 6.4* 6.4*  ALBUMIN 3.3* 3.2*   No results for input(s): "LIPASE", "AMYLASE" in the last 168  hours. No results for input(s): "AMMONIA" in the last 168 hours. Coagulation Profile: No results for input(s): "INR", "PROTIME" in the last 168 hours. Cardiac Enzymes: No results for input(s): "CKTOTAL", "CKMB", "CKMBINDEX", "TROPONINI" in the last 168 hours. BNP (last 3 results) No results for input(s): "PROBNP" in the last 8760 hours. HbA1C: No results for input(s): "HGBA1C" in the last 72 hours. CBG: No results for input(s): "GLUCAP" in the last 168 hours. Lipid Profile: No results for input(s): "CHOL", "HDL", "LDLCALC", "TRIG", "CHOLHDL", "LDLDIRECT" in the last 72 hours. Thyroid Function Tests: Recent Labs    08/16/22 0226  TSH 5.195*  FREET4 1.15*   Anemia Panel: No results for input(s): "VITAMINB12", "FOLATE", "FERRITIN", "TIBC", "IRON", "RETICCTPCT" in the last 72 hours. Sepsis Labs: Recent Labs  Lab 08/15/22 1557  LATICACIDVEN 1.2    No results found for this or any previous visit (from the past 240 hour(s)).       Radiology Studies: CT Head Wo Contrast  Result Date: 08/15/2022 CLINICAL DATA:  Mental status change after being found unresponsive by landlord EXAM: CT HEAD WITHOUT CONTRAST TECHNIQUE: Contiguous axial images were obtained from the base of the skull through the vertex without intravenous contrast. RADIATION DOSE REDUCTION: This exam was performed according to the departmental dose-optimization program which includes automated exposure control, adjustment of the mA and/or kV according to patient size and/or use of iterative reconstruction technique. COMPARISON:  CT head 03/14/2018 FINDINGS: Brain: No intracranial hemorrhage, mass effect, or evidence of acute infarct. No hydrocephalus. No extra-axial fluid collection. Generalized cerebral atrophy. Ill-defined hypoattenuation within the cerebral white matter is nonspecific but consistent with chronic small vessel ischemic disease. Vascular: No hyperdense vessel. Intracranial arterial calcification. Skull: No  fracture or focal lesion. Sinuses/Orbits: No acute finding. Trace air-fluid level in the right maxillary sinus, decreased from prior. Other: None. IMPRESSION: No acute intracranial abnormality. Age-related atrophy and chronic microvascular ischemic change. Electronically Signed   By: Minerva Fester M.D.   On: 08/15/2022 20:06   DG Chest Port 1 View  Result Date: 08/15/2022 CLINICAL DATA:  Altered mental status EXAM: PORTABLE CHEST 1 VIEW COMPARISON:  11/23/2018 FINDINGS: Low lung volumes. No acute airspace disease or pleural effusion. Borderline cardiac enlargement. Aortic atherosclerosis. No pneumothorax. IMPRESSION: No active disease. Low lung volumes. Chronic mild elevation of right diaphragm Electronically Signed   By: Jasmine Pang M.D.   On: 08/15/2022 17:51        Scheduled Meds:  Continuous Infusions:  sodium chloride 75 mL/hr at 08/15/22 2129          Glade Lloyd, MD [facility] 08/16/2022, 8:09 AM

## 2022-08-16 NOTE — Progress Notes (Addendum)
Clinical/Bedside Swallow Evaluation Patient Details  Name: Thomas Rich MRN: [id number] Date of Birth: 11-04-1936  Today's Date: 08/16/2022 Time: SLP Start Time (ACUTE ONLY): 1513 SLP Stop Time (ACUTE ONLY): 1525 SLP Time Calculation (min) (ACUTE ONLY): 12 min  Past Medical History:  Past Medical History:  Diagnosis Date   Arthritis    HOH (hard of hearing)    Past Surgical History: No past surgical history on file. HPI:  Thomas Rich is a 86 y.o. male with medical history significant for hypertension, BPH, bradycardia in 2019, who presented to [facility] ED via EMS after being found unresponsive in his boarding room. Pt found to be bradycardic and encephalopathic.    Assessment / Plan / Recommendation  Clinical Impression  Pt followed several commands for oral-motor exam with overall symmetrical structures, pink and moist mucosa. He is edentulous and denies having dentures. No signs of aspiration across consistencies of thin via straw, bites puree or solid texture. Multiple swallows suggestive of possible pharyngeal retention, vocal quality is normal and independently cleared oral cavity. Therapist downgraded texture to Dys 3 (chopped meats) due to decreased alertness, continue thin and pills whole with puree and follow up ST for upgraded texture. SLP Visit Diagnosis: Dysphagia, unspecified (R13.10)    Aspiration Risk  Mild aspiration risk    Diet Recommendation Dysphagia 3 (Mech soft);Thin liquid   Liquid Administration via: Cup;Straw Medication Administration: Whole meds with puree Supervision: Staff to assist with self feeding Compensations: Minimize environmental distractions;Slow rate;Small sips/bites Postural Changes: Seated upright at 90 degrees    Other  Recommendations Oral Care Recommendations: Oral care BID    Recommendations for follow up therapy are one component of a multi-disciplinary discharge planning process, led by the attending physician.   Recommendations may be updated based on patient status, additional functional criteria and insurance authorization.  Follow up Recommendations  (TBD)      Assistance Recommended at Discharge  (TBD)  Functional Status Assessment Patient has had a recent decline in their functional status and demonstrates the ability to make significant improvements in function in a reasonable and predictable amount of time.  Frequency and Duration min 2x/week  2 weeks       Prognosis Prognosis for Safe Diet Advancement: Good Barriers to Reach Goals: Cognitive deficits      Swallow Study   General Date of Onset: 08/16/22 HPI: Thomas Rich is a 86 y.o. male with medical history significant for hypertension, BPH, bradycardia in 2019, who presented to [facility] ED via EMS after being found unresponsive in his boarding room. Pt found to be bradycardic and encephalopathic. Type of Study: Bedside Swallow Evaluation Previous Swallow Assessment: no Diet Prior to this Study: Regular;Thin liquids Temperature Spikes Noted: No Respiratory Status: Room air History of Recent Intubation: No Behavior/Cognition: Cooperative;Pleasant mood;Requires cueing Oral Cavity Assessment: Within Functional Limits Oral Care Completed by SLP: No Oral Cavity - Dentition: Edentulous Vision:  (kept eyes closed) Self-Feeding Abilities: Needs assist Patient Positioning: Upright in bed Baseline Vocal Quality:  (did/would not phonate prior to po's)    Oral/Motor/Sensory Function Overall Oral Motor/Sensory Function:  (no overt abnormalities)   Ice Chips Ice chips: Not tested   Thin Liquid Thin Liquid: Within functional limits Presentation: Straw    Nectar Thick Nectar Thick Liquid: Not tested   Honey Thick Honey Thick Liquid: Not tested   Puree Puree: Impaired Pharyngeal Phase Impairments: Multiple swallows   Solid     Solid: Impaired Oral Phase Impairments:  (delayed propulsion)      Thomas Rich,  Thomas Rich 08/16/2022,3:49 PM

## 2022-08-16 NOTE — Consult Note (Signed)
Consultation Note Date: 08/16/2022   Patient Name: Thomas Rich  DOB: August 23, 1936  MRN: [id number]  Age / Sex: 86 y.o., male  PCP: Patient, No Pcp Per Referring Physician: Aline August, MD  Reason for Consultation: Establishing goals of care  HPI/Patient Profile: 86 y.o. male  with past medical history of hypertension, BPH, bradycardia in 201,9 admitted on 08/15/2022 with unresponsiveness, found down by landlord.   Patient brought in by EMS who was able to get him to respond.  Admitted for ongoing work-up for bradycardia and acute metabolic encephalopathy.  PMT has been consulted to assist with goals of care conversation.  Clinical Assessment and Goals of Care:  I have reviewed medical records including EPIC notes, labs and imaging, assessed the patient and then had phone conversations with patient's neighbor Coralyn Mark, Dan/son's mother Bonnita Nasuti, and sons Izola Price and Linna Hoff to discuss diagnosis prognosis, GOC, EOL wishes, disposition and options.  I introduced Palliative Medicine as specialized medical care for people living with serious illness. It focuses on providing relief from the symptoms and stress of a serious illness. The goal is to improve quality of life for both the patient and the family.  We discussed a brief life review of the patient and then focused on their current illness.   I attempted to elicit values and goals of care important to the patient.    Medical History Review and Understanding:  Patient's family cite concerns with possible dementia since around 2019.  Provided with updates and review of his current acute illness with planned work-up and specialist insight.  Emphasized guarded prognosis and family understands that anything can happen at any time given his rapid decline.  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  He mentions a son that is being "called regularly" but is unable to name him.  He tells me the contact Ocie Doyne is his cousin but otherwise it is difficult to keep him focused.  He has 2 sons who live in [place] and [place].  Functional and Nutritional State: Patient usually ambulates independently and will occasionally use a single crutch for assistance.  Neighbors in his boarding house have been bringing him breakfast and dinner, water, but he has hardly touched anything brought to him over the past couple of weeks.  He has been declining since he had a fall and refused to come to the ED around that time.  Palliative Symptoms: Patient states "I am okay"  Code Status: Concepts specific to code status, artifical feeding and hydration, and rehospitalization were considered and discussed.  Patient's family do not feel he would be interested in PEG tube, cardiopulmonary resuscitation, or other aggressive interventions.  Discussion: Discussed with patient's neighbor Coralyn Mark initially to find out about his recent baseline as stated above.  Coralyn Mark then provided me with his ex-wife/son's mother Helen's contact information.  Bonnita Nasuti told me they have not seen the patient in person for around 10 years.  She does not feel he would want aggressive interventions if he were to continue declining, but states she will need to talk to her son, who works until later in the afternoon.  She provided me with the other son's contact information and he and I had a good conversation.  We discussed that while it is possible he will improve, he may also be rallying before transitioning to end stages of possible dementia or end-of-life due to other chronically untreated illnesses.  Izola Price  plans to visit in person soon and would like to continue with current care in the hopes that he can see patient in person and convince him to finally come home with him.  He states that patient has trauma with  losing loved ones, having recently lost siblings as well as his mother in early childhood.  He feels patient has been distancing himself from other family to avoid the pain of further loss.  He is very appreciative of updates and would like to speak with patient's attending when possible.  I then spoke to patient's son Jesusita Oka when he got off work to provide updates on the conversations throughout the day.  Jesusita Oka reiterates his agreement with Leighton Parody and that Leighton Parody should be the primary contact.  At his request, we discussed what it would look like for patient to go to SNF temporarily should he improve.  He agrees patient is not safe to care for himself anymore based on what he has heard.   Discussed the importance of continued conversation with family and the medical providers regarding overall plan of care and treatment options, ensuring decisions are within the context of the patient's values and GOCs.   Questions and concerns were addressed. The family was encouraged to call with questions or concerns.  PMT will continue to support holistically.   NEXT OF KIN are sons Leighton Parody and Dessa Phi is the primary contact.    SUMMARY OF RECOMMENDATIONS   -CODE STATUS changed to DNR after discussion with son Leighton Parody -Continue current care with no escalation and no heroics/aggressive measures; family goal/hope is that he improves enough with gentle interventions to eventually move to [facility] to live with Leighton Parody -Sons' contact information has been added to facesheet for further communication, sons will be very appreciative of updates -Psychosocial and emotional support provided -PMT will continue to follow and support  Prognosis:  Guarded  Discharge Planning: To Be Determined      Primary Diagnoses: Present on Admission:  Unresponsiveness  Physical Exam Vitals and nursing note reviewed.  Constitutional:      General: He is not in acute distress.    Appearance: He is ill-appearing.     Comments:  Disheveled, poor hygiene  Cardiovascular:     Rate and Rhythm: Bradycardia present.  Pulmonary:     Effort: Pulmonary effort is normal. No respiratory distress.  Neurological:     Mental Status: He is easily aroused. He is disoriented.  Psychiatric:        Behavior: Behavior is cooperative.        Cognition and Memory: Cognition is impaired. He exhibits impaired recent memory.    Vital Signs: BP 114/75   Pulse (!) 58   Temp (!) 97.5 F (36.4 C) (Axillary)   Resp 14   SpO2 100%        SpO2: SpO2: 100 % O2 Device:SpO2: 100 % O2 Flow Rate: .    Palliative Assessment/Data:     Total time: I spent 120 minutes in the care of the patient today in the above activities and documenting the encounter.   Shamari Trostel Jeni Salles, PA-C  Palliative Medicine Team Team phone # [phone]  Thank you for allowing the Palliative Medicine Team to assist in the care of this patient. Please utilize secure chat with additional questions, if there is no response within 30 minutes please call the above phone number.  Palliative Medicine Team providers are available by phone from 7am to 7pm daily and can be reached through the  team cell phone.  Should this patient require assistance outside of these hours, please call the patient's attending physician.

## 2022-08-16 NOTE — ED Notes (Signed)
ED TO INPATIENT HANDOFF REPORT  ED Nurse Name and Phone #: Lenell Antu Name/Age/Gender Thomas Rich 86 y.o. male Room/Bed: 022C/022C  Code Status   Code Status: DNR  Home/SNF/Other  Orientation and baseline unknown at this time. PT is not talking. Is this baseline? Unknown   Triage Complete: Triage complete  Chief Complaint Unresponsiveness [R41.89]  Triage Note Pt BIB EMS from boarding housing d/t being unresponsive by landlord. Upon EMS arrival patient was found to be bradycardic in the 40's. Patient is confused but baseline is unknown; possible dementia.   VSS w/ EMS besides continuous bradycardia.    Allergies Allergies  Allergen Reactions   Augmentin [Amoxicillin-Pot Clavulanate] Rash    Has patient had a PCN reaction causing immediate rash, facial/tongue/throat swelling, SOB or lightheadedness with hypotension: unknown Has patient had a PCN reaction causing severe rash involving mucus membranes or skin necrosis: unknown Has patient had a PCN reaction that required hospitalization unknown Has patient had a PCN reaction occurring within the last 10 years: unknown If all of the above answers are "NO", then may proceed with Cephalosporin use.     Level of Care/Admitting Diagnosis ED Disposition     ED Disposition  Admit   Condition  --   Comment  Hospital [facility] [[id number]]  Level of Care: Telemetry Cardiac [[id number]]  May admit patient to [facility] or [facility] if equivalent level of care is available:: No  Covid Evaluation: Asymptomatic - no recent exposure (last 10 days) testing not required  Diagnosis: Unresponsiveness [710398]  Admitting Physician: Darlin Drop [[id number]]  Attending Physician: Darlin Drop [[id number]]  Certification:: I certify this patient will need inpatient services for at least 2 midnights  Estimated Length of Stay: 2          B Medical/Surgery History Past Medical History:  Diagnosis Date    Arthritis    HOH (hard of hearing)    No past surgical history on file.   A IV Location/Drains/Wounds Patient Lines/Drains/Airways Status     Active Line/Drains/Airways     Name Placement date Placement time Site Days   Peripheral IV 08/15/22 20 G Left Antecubital 08/15/22  1531  Antecubital  1   Peripheral IV 08/16/22 20 G 1" Anterior;Right Forearm 08/16/22  0012  Forearm  less than 1            Intake/Output Last 24 hours No intake or output data in the 24 hours ending 08/16/22 1601  Labs/Imaging Results for orders placed or performed during the hospital encounter of 08/15/22 (from the past 48 hour(s))  Comprehensive metabolic panel     Status: Abnormal   Collection Time: 08/15/22  3:57 PM  Result Value Ref Range   Sodium 135 135 - 145 mmol/L   Potassium 4.5 3.5 - 5.1 mmol/L    Comment: HEMOLYSIS AT THIS LEVEL MAY AFFECT RESULT   Chloride 99 98 - 111 mmol/L   CO2 26 22 - 32 mmol/L   Glucose, Bld 94 70 - 99 mg/dL    Comment: Glucose reference range applies only to samples taken after fasting for at least 8 hours.   BUN 18 8 - 23 mg/dL   Creatinine, Ser 8.83 0.61 - 1.24 mg/dL   Calcium 8.6 (L) 8.9 - 10.3 mg/dL   Total Protein 6.4 (L) 6.5 - 8.1 g/dL   Albumin 3.3 (L) 3.5 - 5.0 g/dL   AST 34 15 - 41 U/L    Comment: HEMOLYSIS AT THIS  LEVEL MAY AFFECT RESULT   ALT 18 0 - 44 U/L    Comment: HEMOLYSIS AT THIS LEVEL MAY AFFECT RESULT   Alkaline Phosphatase 44 38 - 126 U/L   Total Bilirubin 3.0 (H) 0.3 - 1.2 mg/dL    Comment: HEMOLYSIS AT THIS LEVEL MAY AFFECT RESULT   GFR, Estimated >60 >60 mL/min    Comment: (NOTE) Calculated using the CKD-EPI Creatinine Equation (2021)    Anion gap 10 5 - 15    Comment: Performed at [facility], [address], [place], [place] [address]  CBC with Differential     Status: Abnormal   Collection Time: 08/15/22  3:57 PM  Result Value Ref Range   WBC 3.0 (L) 4.0 - 10.5 K/uL   RBC 2.62 (L) 4.22 - 5.81 MIL/uL   Hemoglobin 11.1  (L) 13.0 - 17.0 g/dL   HCT 41.6 (L) 38.4 - 53.6 %   MCV 119.8 (H) 80.0 - 100.0 fL   MCH 42.4 (H) 26.0 - 34.0 pg   MCHC 35.4 30.0 - 36.0 g/dL   RDW 46.8 03.2 - 12.2 %   Platelets 52 (L) 150 - 400 K/uL    Comment: Immature Platelet Fraction may be clinically indicated, consider ordering this additional test [id number] REPEATED TO VERIFY PLATELET COUNT CONFIRMED BY SMEAR    nRBC 0.0 0.0 - 0.2 %   Neutrophils Relative % 56 %   Neutro Abs 1.7 1.7 - 7.7 K/uL   Lymphocytes Relative 31 %   Lymphs Abs 0.9 0.7 - 4.0 K/uL   Monocytes Relative 8 %   Monocytes Absolute 0.2 0.1 - 1.0 K/uL   Eosinophils Relative 4 %   Eosinophils Absolute 0.1 0.0 - 0.5 K/uL   Basophils Relative 1 %   Basophils Absolute 0.0 0.0 - 0.1 K/uL   WBC Morphology MORPHOLOGY UNREMARKABLE    Smear Review PLATELETS APPEAR DECREASED    Immature Granulocytes 0 %   Abs Immature Granulocytes 0.01 0.00 - 0.07 K/uL    Comment: Performed at [facility], [address] [place], [place] [address]  Lactic acid, plasma     Status: None   Collection Time: 08/15/22  3:57 PM  Result Value Ref Range   Lactic Acid, Venous 1.2 0.5 - 1.9 mmol/L    Comment: Performed at [facility], [address] [place], [place] [address]  I-stat chem 8, ED     Status: Abnormal   Collection Time: 08/15/22  4:21 PM  Result Value Ref Range   Sodium 135 135 - 145 mmol/L   Potassium 5.4 (H) 3.5 - 5.1 mmol/L   Chloride 99 98 - 111 mmol/L   BUN 26 (H) 8 - 23 mg/dL   Creatinine, Ser 5.03 0.61 - 1.24 mg/dL   Glucose, Bld 88 70 - 99 mg/dL    Comment: Glucose reference range applies only to samples taken after fasting for at least 8 hours.   Calcium, Ion 1.00 (L) 1.15 - 1.40 mmol/L   TCO2 30 22 - 32 mmol/L   Hemoglobin 11.6 (L) 13.0 - 17.0 g/dL   HCT 88.8 (L) 28.0 - 03.4 %  CBC with Differential/Platelet     Status: Abnormal   Collection Time: 08/16/22  2:26 AM  Result Value Ref Range   WBC 3.3 (L) 4.0 - 10.5 K/uL   RBC 2.52 (L) 4.22 -  5.81 MIL/uL   Hemoglobin 10.6 (L) 13.0 - 17.0 g/dL   HCT 91.7 (L) 91.5 - 05.6 %  MCV 122.6 (H) 80.0 - 100.0 fL   MCH 42.1 (H) 26.0 - 34.0 pg   MCHC 34.3 30.0 - 36.0 g/dL   RDW 83.3 82.5 - 05.3 %   Platelets 50 (L) 150 - 400 K/uL    Comment: CONSISTENT WITH PREVIOUS RESULT REPEATED TO VERIFY    nRBC 0.0 0.0 - 0.2 %   Neutrophils Relative % 51 %   Neutro Abs 1.7 1.7 - 7.7 K/uL   Lymphocytes Relative 36 %   Lymphs Abs 1.2 0.7 - 4.0 K/uL   Monocytes Relative 7 %   Monocytes Absolute 0.2 0.1 - 1.0 K/uL   Eosinophils Relative 5 %   Eosinophils Absolute 0.2 0.0 - 0.5 K/uL   Basophils Relative 1 %   Basophils Absolute 0.0 0.0 - 0.1 K/uL   Immature Granulocytes 0 %   Abs Immature Granulocytes 0.01 0.00 - 0.07 K/uL    Comment: Performed at [facility], [address] [place], [place] [address]  Comprehensive metabolic panel     Status: Abnormal   Collection Time: 08/16/22  2:26 AM  Result Value Ref Range   Sodium 138 135 - 145 mmol/L   Potassium 3.5 3.5 - 5.1 mmol/L   Chloride 100 98 - 111 mmol/L   CO2 25 22 - 32 mmol/L   Glucose, Bld 85 70 - 99 mg/dL    Comment: Glucose reference range applies only to samples taken after fasting for at least 8 hours.   BUN 17 8 - 23 mg/dL   Creatinine, Ser 4.19 0.61 - 1.24 mg/dL   Calcium 8.7 (L) 8.9 - 10.3 mg/dL   Total Protein 6.4 (L) 6.5 - 8.1 g/dL   Albumin 3.2 (L) 3.5 - 5.0 g/dL   AST 26 15 - 41 U/L   ALT 19 0 - 44 U/L   Alkaline Phosphatase 42 38 - 126 U/L   Total Bilirubin 2.9 (H) 0.3 - 1.2 mg/dL   GFR, Estimated >37 >90 mL/min    Comment: (NOTE) Calculated using the CKD-EPI Creatinine Equation (2021)    Anion gap 13 5 - 15    Comment: Performed at [facility], [address] [place], [place] [address]  Magnesium     Status: None   Collection Time: 08/16/22  2:26 AM  Result Value Ref Range   Magnesium 1.7 1.7 - 2.4 mg/dL    Comment: Performed at [facility], [address], [place], [place] [address]   Phosphorus     Status: None   Collection Time: 08/16/22  2:26 AM  Result Value Ref Range   Phosphorus 3.3 2.5 - 4.6 mg/dL    Comment: Performed at [facility], [address], [place], [place] [address]  TSH     Status: Abnormal   Collection Time: 08/16/22  2:26 AM  Result Value Ref Range   TSH 5.195 (H) 0.350 - 4.500 uIU/mL    Comment: Performed by a 3rd Generation assay with a functional sensitivity of <=0.01 uIU/mL. Performed at [facility], [address], [place], [place] [address]   T4, free     Status: Abnormal   Collection Time: 08/16/22  2:26 AM  Result Value Ref Range   Free T4 1.15 (H) 0.61 - 1.12 ng/dL    Comment: (NOTE) Biotin ingestion may interfere with free T4 tests. If the results are inconsistent with the TSH level, previous test results, or the clinical presentation, then consider biotin interference. If needed, order  repeat testing after stopping biotin. Performed at [facility], [address] [place], [place] [id number]    ECHOCARDIOGRAM COMPLETE  Result Date: 08/16/2022    ECHOCARDIOGRAM REPORT   Patient Name:   Thomas SquiresSIOSAIA Rich Date of Exam: 08/16/2022 Medical Rec #:  [id number]             Height:       68.0 in Accession #:    [id number]            Weight:       160.0 lb Date of Birth:  [birth year]             BSA:          1.859 m Patient Age:    86 years              BP:           114/75 mmHg Patient Gender: M                     HR:           63 bpm. Exam Location:  Inpatient Procedure: 2D Echo, Color Doppler and Cardiac Doppler Indications:    Abnormal ECG  History:        Patient has no prior history of Echocardiogram examinations.                 Arrythmias:Bradycardia; Risk Factors:Hypertension.  Sonographer:    Milda SmartShannon O'Grady Referring Phys: [id number] CAROLE N HALL  Sonographer Comments: Technically difficult study due to poor echo windows. Image acquisition challenging due to patient body habitus, Image acquisition challenging due  to respiratory motion and Image acquisition challenging due to uncooperative patient.  Pt somnolent during exam. IMPRESSIONS  1. Left ventricular ejection fraction, by estimation, is 50 to 55%. Left ventricular ejection fraction by PLAX is 50 %. The left ventricle has low normal function. The left ventricle has no regional wall motion abnormalities. There is mild left ventricular hypertrophy. Left ventricular diastolic parameters are consistent with Grade I diastolic dysfunction (impaired relaxation).  2. Right ventricular systolic function is moderately reduced. The right ventricular size is normal.  3. The mitral valve is grossly normal. Trivial mitral valve regurgitation.  4. The aortic valve is tricuspid. Aortic valve regurgitation is trivial. Comparison(s): No prior Echocardiogram. FINDINGS  Left Ventricle: Left ventricular ejection fraction, by estimation, is 50 to 55%. Left ventricular ejection fraction by PLAX is 50 %. The left ventricle has low normal function. The left ventricle has no regional wall motion abnormalities. The left ventricular internal cavity size was normal in size. There is mild left ventricular hypertrophy. Left ventricular diastolic parameters are consistent with Grade I diastolic dysfunction (impaired relaxation). Indeterminate filling pressures. Right Ventricle: The right ventricular size is normal. No increase in right ventricular wall thickness. Right ventricular systolic function is moderately reduced. Left Atrium: Left atrial size was normal in size. Right Atrium: Right atrial size was normal in size. Pericardium: There is no evidence of pericardial effusion. Mitral Valve: The mitral valve is grossly normal. Trivial mitral valve regurgitation. Tricuspid Valve: The tricuspid valve is grossly normal. Tricuspid valve regurgitation is trivial. Aortic Valve: The aortic valve is tricuspid. Aortic valve regurgitation is trivial. Pulmonic Valve: The pulmonic valve was normal in structure.  Pulmonic valve regurgitation is not visualized. Aorta: The aortic root and ascending aorta are structurally normal, with no evidence of dilitation. IAS/Shunts: No atrial level shunt detected by color flow Doppler.  LEFT  VENTRICLE PLAX 2D LV EF:         Left            Diastology                ventricular     LV e' medial:    4.46 cm/s                ejection        LV E/e' medial:  12.6                fraction by     LV e' lateral:   6.31 cm/s                PLAX is 50      LV E/e' lateral: 8.9                %. LVIDd:         2.90 cm LVIDs:         2.20 cm LV PW:         1.00 cm LV IVS:        1.20 cm LVOT diam:     2.20 cm LV SV:         62 LV SV Index:   33 LVOT Area:     3.80 cm  RIGHT VENTRICLE RV S prime:     7.29 cm/s TAPSE (M-mode): 0.9 cm LEFT ATRIUM           Index        RIGHT ATRIUM          Index LA diam:      3.50 cm 1.88 cm/m   RA Area:     9.19 cm LA Vol (A2C): 58.0 ml 31.20 ml/m  RA Volume:   21.10 ml 11.35 ml/m LA Vol (A4C): 31.7 ml 17.05 ml/m  AORTIC VALVE LVOT Vmax:   78.30 cm/s LVOT Vmean:  58.100 cm/s LVOT VTI:    0.163 m  AORTA Ao Root diam: 3.40 cm MITRAL VALVE MV Area (PHT): 3.36 cm    SHUNTS MV Decel Time: 226 msec    Systemic VTI:  0.16 m MV E velocity: 56.00 cm/s  Systemic Diam: 2.20 cm MV A velocity: 62.10 cm/s MV E/A ratio:  0.90 Lyman Bishop MD Electronically signed by Lyman Bishop MD Signature Date/Time: 08/16/2022/3:43:11 PM    Final    CT Head Wo Contrast  Result Date: 08/15/2022 CLINICAL DATA:  Mental status change after being found unresponsive by landlord EXAM: CT HEAD WITHOUT CONTRAST TECHNIQUE: Contiguous axial images were obtained from the base of the skull through the vertex without intravenous contrast. RADIATION DOSE REDUCTION: This exam was performed according to the departmental dose-optimization program which includes automated exposure control, adjustment of the mA and/or kV according to patient size and/or use of iterative reconstruction technique.  COMPARISON:  CT head 03/14/2018 FINDINGS: Brain: No intracranial hemorrhage, mass effect, or evidence of acute infarct. No hydrocephalus. No extra-axial fluid collection. Generalized cerebral atrophy. Ill-defined hypoattenuation within the cerebral white matter is nonspecific but consistent with chronic small vessel ischemic disease. Vascular: No hyperdense vessel. Intracranial arterial calcification. Skull: No fracture or focal lesion. Sinuses/Orbits: No acute finding. Trace air-fluid level in the right maxillary sinus, decreased from prior. Other: None. IMPRESSION: No acute intracranial abnormality. Age-related atrophy and chronic microvascular ischemic change. Electronically Signed   By: Placido Sou M.D.   On: 08/15/2022 20:06   DG Chest [facility] [address]  Result Date: 08/15/2022 CLINICAL DATA:  Altered mental status EXAM: PORTABLE CHEST 1 VIEW COMPARISON:  11/23/2018 FINDINGS: Low lung volumes. No acute airspace disease or pleural effusion. Borderline cardiac enlargement. Aortic atherosclerosis. No pneumothorax. IMPRESSION: No active disease. Low lung volumes. Chronic mild elevation of right diaphragm Electronically Signed   By: Jasmine Pang M.D.   On: 08/15/2022 17:51    Pending Labs Unresulted Labs (From admission, onward)     Start     Ordered   08/17/22 0500  Vitamin B12  Tomorrow morning,   R        08/16/22 0815   08/17/22 0500  Ammonia  Tomorrow morning,   R        08/16/22 0815   08/17/22 0500  CBC with Differential/Platelet  Tomorrow morning,   R        08/16/22 1009   08/17/22 0500  Comprehensive metabolic panel  Tomorrow morning,   R        08/16/22 1009   08/16/22 1031  Urinalysis, Routine w reflex microscopic  Once,   R        08/16/22 1030   08/15/22 2044  Potassium  Once,   R        08/15/22 2043   08/15/22 2037  Urinalysis, Routine w reflex microscopic Urine, Clean Catch  ONCE - URGENT,   URGENT        08/15/22 2036   08/15/22 1534  Culture, blood (routine x 2)  BLOOD  CULTURE X 2,   R (with STAT occurrences)      08/15/22 1534            Vitals/Pain Today's Vitals   08/16/22 1000 08/16/22 1015 08/16/22 1030 08/16/22 1138  BP: (!) 109/56 (!) 90/57 114/75   Pulse:   (!) 58   Resp:   14   Temp:    (!) 97.5 F (36.4 C)  TempSrc:    Axillary  SpO2:   100%     Isolation Precautions No active isolations  Medications Medications  0.9 %  sodium chloride infusion (0 mLs Intravenous Stopped 08/16/22 1504)  acetaminophen (TYLENOL) tablet 650 mg (has no administration in time range)  ondansetron (ZOFRAN) injection 4 mg (has no administration in time range)  polyethylene glycol (MIRALAX / GLYCOLAX) packet 17 g (has no administration in time range)  thiamine (VITAMIN B1) 500 mg in normal saline (50 mL) IVPB (has no administration in time range)  folic acid (FOLVITE) tablet 1 mg (has no administration in time range)  LORazepam (ATIVAN) injection 1 mg (1 mg Intravenous Given 08/15/22 1929)    Mobility Baseline mobility unknown, pt is not-ambulatory at this time     Focused Assessments    R Recommendations: See Admitting Provider Note  Report given to:   Additional Notes:

## 2022-08-16 NOTE — Progress Notes (Signed)
Cardiology Progress Note  Patient ID: Thomas Rich MRN: [id number] DOB: Aug 05, 1936 Date of Encounter: 08/16/2022  Primary Cardiologist: None  Subjective   Chief Complaint: none.   HPI: Sleeping.  Not following commands.  Not cooperative with exam.  ROS:  All other ROS reviewed and negative. Pertinent positives noted in the HPI.     Inpatient Medications  Scheduled Meds:  folic acid  1 mg Oral Daily   Continuous Infusions:  sodium chloride 75 mL/hr at 08/15/22 2129   thiamine (VITAMIN B1) injection     PRN Meds: acetaminophen, ondansetron (ZOFRAN) IV, polyethylene glycol   Vital Signs   Vitals:   08/16/22 0400 08/16/22 0415 08/16/22 0430 08/16/22 0700  BP: (!) 144/89 114/80 119/73 121/88  Pulse: (!) 57 (!) 51 (!) 53 64  Resp: 14 15 14 12   Temp:    (!) 97.5 F (36.4 C)  TempSrc:    Oral  SpO2: 100% 100% 100% 100%   No intake or output data in the 24 hours ending 08/16/22 1017    07/28/2022   10:10 AM 09/20/2018    9:21 AM 08/16/2018   10:45 AM  Last 3 Weights  Weight (lbs) 160 lb 186 lb 191 lb 5.8 oz  Weight (kg) 72.576 kg 84.369 kg 86.8 kg      Telemetry  Overnight telemetry shows sinus bradycardia heart rate down to 30 to 40 bpm, which I personally reviewed.   ECG  The most recent ECG shows sinus bradycardia heart rate 49, left anterior fascicular block, which I personally reviewed.   Physical Exam   Vitals:   08/16/22 0400 08/16/22 0415 08/16/22 0430 08/16/22 0700  BP: (!) 144/89 114/80 119/73 121/88  Pulse: (!) 57 (!) 51 (!) 53 64  Resp: 14 15 14 12   Temp:    (!) 97.5 F (36.4 C)  TempSrc:    Oral  SpO2: 100% 100% 100% 100%   No intake or output data in the 24 hours ending 08/16/22 1017     07/28/2022   10:10 AM 09/20/2018    9:21 AM 08/16/2018   10:45 AM  Last 3 Weights  Weight (lbs) 160 lb 186 lb 191 lb 5.8 oz  Weight (kg) 72.576 kg 84.369 kg 86.8 kg    There is no height or weight on file to calculate BMI.  General: Unkempt  appearance Head: Atraumatic, normal size  Eyes: PEERLA, EOMI  Neck: Supple, no JVD Endocrine: No thryomegaly Cardiac: Normal S1, S2; RRR; no murmurs, rubs, or gallops Lungs: Clear to auscultation bilaterally, no wheezing, rhonchi or rales  Abd: Soft, nontender, no hepatomegaly  Ext: No edema, pulses 2+ Musculoskeletal: No deformities Skin: Warm and dry, no rashes   Neuro: Sleepy, will not awaken follow commands, uncooperative  Labs  High Sensitivity Troponin:  No results for input(s): "TROPONINIHS" in the last 720 hours.   Cardiac EnzymesNo results for input(s): "TROPONINI" in the last 168 hours. No results for input(s): "TROPIPOC" in the last 168 hours.  Chemistry Recent Labs  Lab 08/15/22 1557 08/15/22 1621 08/16/22 0226  NA 135 135 138  K 4.5 5.4* 3.5  CL 99 99 100  CO2 26  --  25  GLUCOSE 94 88 85  BUN 18 26* 17  CREATININE 1.01 1.00 0.96  CALCIUM 8.6*  --  8.7*  PROT 6.4*  --  6.4*  ALBUMIN 3.3*  --  3.2*  AST 34  --  26  ALT 18  --  19  ALKPHOS 44  --  42  BILITOT 3.0*  --  2.9*  GFRNONAA >60  --  >60  ANIONGAP 10  --  13    Hematology Recent Labs  Lab 08/15/22 1557 08/15/22 1621 08/16/22 0226  WBC 3.0*  --  3.3*  RBC 2.62*  --  2.52*  HGB 11.1* 11.6* 10.6*  HCT 31.4* 34.0* 30.9*  MCV 119.8*  --  122.6*  MCH 42.4*  --  42.1*  MCHC 35.4  --  34.3  RDW 13.8  --  13.7  PLT 52*  --  50*   BNPNo results for input(s): "BNP", "PROBNP" in the last 168 hours.  DDimer No results for input(s): "DDIMER" in the last 168 hours.   Radiology  CT Head Wo Contrast  Result Date: 08/15/2022 CLINICAL DATA:  Mental status change after being found unresponsive by landlord EXAM: CT HEAD WITHOUT CONTRAST TECHNIQUE: Contiguous axial images were obtained from the base of the skull through the vertex without intravenous contrast. RADIATION DOSE REDUCTION: This exam was performed according to the departmental dose-optimization program which includes automated exposure control,  adjustment of the mA and/or kV according to patient size and/or use of iterative reconstruction technique. COMPARISON:  CT head 03/14/2018 FINDINGS: Brain: No intracranial hemorrhage, mass effect, or evidence of acute infarct. No hydrocephalus. No extra-axial fluid collection. Generalized cerebral atrophy. Ill-defined hypoattenuation within the cerebral white matter is nonspecific but consistent with chronic small vessel ischemic disease. Vascular: No hyperdense vessel. Intracranial arterial calcification. Skull: No fracture or focal lesion. Sinuses/Orbits: No acute finding. Trace air-fluid level in the right maxillary sinus, decreased from prior. Other: None. IMPRESSION: No acute intracranial abnormality. Age-related atrophy and chronic microvascular ischemic change. Electronically Signed   By: Minerva Fester M.D.   On: 08/15/2022 20:06   DG Chest Port 1 View  Result Date: 08/15/2022 CLINICAL DATA:  Altered mental status EXAM: PORTABLE CHEST 1 VIEW COMPARISON:  11/23/2018 FINDINGS: Low lung volumes. No acute airspace disease or pleural effusion. Borderline cardiac enlargement. Aortic atherosclerosis. No pneumothorax. IMPRESSION: No active disease. Low lung volumes. Chronic mild elevation of right diaphragm Electronically Signed   By: Jasmine Pang M.D.   On: 08/15/2022 17:51    Cardiac Studies  Echo pending  Patient Profile  Thomas Rich is a 86 y.o. male with hypertension, BPH who was admitted to [facility]/15/2022 after being found unresponsive by his landlord.  Cardiology was consulted for sinus bradycardia.  Assessment & Plan   #Acute encephalopathy #Sinus bradycardia -Admitted after being minimally responsive in his apartment.  Brought to the emergency room where he been hemodynamically stable.  He has had sinus bradycardia which is not new.  His heart rate can dip down into the 30s or 40s beats per minute.  All of his conduction is normal on my review of his telemetry.  He  is sleeping when his heart rate dips down. -Heart rate will come up into the 60s.  To me heart rate is not the issue.  No indications for pacing. -Labs show normal kidney function.  He does have a pancytopenia which needs work-up.  I suspect something metabolic explains his overall condition. -TSH is elevated but free T4 is elevated.  This is consistent with euthyroid sick syndrome. -An echocardiogram is pending.  As long as this is normal no further cardiology work-up is needed.  Would recommend medical work-up per the hospital medicine team.  [facility] will sign off.   Medication Recommendations: As above Other recommendations (labs, testing, etc): None  Follow up as an outpatient: None needed.  This appears to be a metabolic issue.  His bradycardia is not new.  For questions or updates, please contact [facility] Please consult [email or web address] for contact info under   Signed, Gerri Spore T. Flora Lipps, MD, [facility] [place]  [facility]  08/16/2022 10:17 AM

## 2022-08-16 NOTE — Progress Notes (Signed)
Pt refusing MRI. On-call MD made aware.

## 2022-08-16 NOTE — Progress Notes (Signed)
EEG complete - results pending 

## 2022-08-16 NOTE — Progress Notes (Signed)
  Echocardiogram 2D Echocardiogram has been performed.  Eartha Inch 08/16/2022, 2:42 PM

## 2022-08-16 NOTE — ED Notes (Addendum)
This RN asked phlebotomy to collect 2nd blood cultures and labs. Per phlebotomy they will collect during rounds of morning labs.

## 2022-08-17 ENCOUNTER — Inpatient Hospital Stay (HOSPITAL_COMMUNITY): Payer: Medicare Other

## 2022-08-17 DIAGNOSIS — R4182 Altered mental status, unspecified: Secondary | ICD-10-CM

## 2022-08-17 DIAGNOSIS — G9341 Metabolic encephalopathy: Secondary | ICD-10-CM | POA: Diagnosis not present

## 2022-08-17 DIAGNOSIS — D61818 Other pancytopenia: Secondary | ICD-10-CM | POA: Diagnosis not present

## 2022-08-17 DIAGNOSIS — R001 Bradycardia, unspecified: Secondary | ICD-10-CM | POA: Diagnosis not present

## 2022-08-17 DIAGNOSIS — R4189 Other symptoms and signs involving cognitive functions and awareness: Secondary | ICD-10-CM | POA: Diagnosis not present

## 2022-08-17 MED ORDER — HALOPERIDOL LACTATE 5 MG/ML IJ SOLN: 1.0000 mg | Freq: Four times a day (QID) | INTRAMUSCULAR | Status: DC | PRN

## 2022-08-17 NOTE — Progress Notes (Signed)
Pt becoming agitated and aggressive swinging at staff members. Pt refusing lab draw. On-call MD made aware.

## 2022-08-17 NOTE — Evaluation (Signed)
Physical Therapy Evaluation Patient Details Name: Thomas Rich MRN: [id number] DOB: 1936-09-29 Today's Date: 08/17/2022  History of Present Illness  86 yo admitted 9/24 after found unresponsive at boarding house. Pt with bradycardia to 40s. PMhx: HOH, arthritis, BPH, hypotension, questionable dementia  Clinical Impression  Pt pleasant and stating name and that he can be called "Thomas Rich". Pt sharing that he is from [place] then to [place] for school. Pt with decreased command following, limited ability to provide PLOF and home setup with decreased activity tolerance, balance and function who will benefit from acute therapy to maximize mobility and safety. Pt with HR 70-90 at rest with rate up to 135 with limited gait. Unable to get pulse ox reading throughout session with RN aware and 3 different probes attempted with pt stating no SOB on RA.        Recommendations for follow up therapy are one component of a multi-disciplinary discharge planning process, led by the attending physician.  Recommendations may be updated based on patient status, additional functional criteria and insurance authorization.  Follow Up Recommendations Skilled nursing-short term rehab (<3 hours/day) Can patient physically be transported by private vehicle: Yes    Assistance Recommended at Discharge Frequent or constant Supervision/Assistance  Patient can return home with the following  A little help with walking and/or transfers;A little help with bathing/dressing/bathroom;Assistance with cooking/housework;Direct supervision/assist for financial management;Assist for transportation;Direct supervision/assist for medications management    Equipment Recommendations Rolling walker (2 wheels)  Recommendations for Other Services       Functional Status Assessment Patient has had a recent decline in their functional status and demonstrates the ability to make significant improvements in function in a reasonable  and predictable amount of time.     Precautions / Restrictions Precautions Precautions: Fall;Other (comment) Precaution Comments: watch sats (could not get reading) and HR Restrictions Weight Bearing Restrictions: No      Mobility  Bed Mobility Overal bed mobility: Needs Assistance Bed Mobility: Supine to Sit     Supine to sit: Min assist, HOB elevated     General bed mobility comments: HOB 30 degrees, min assist to initiate and complete transition to sitting    Transfers Overall transfer level: Needs assistance   Transfers: Sit to/from Stand Sit to Stand: Min assist           General transfer comment: min assist to initiate standing from bed and chair with increased cues and time. Pt with lack of awareness for safety with transition from bed to chair    Ambulation/Gait Ambulation/Gait assistance: Min assist Gait Distance (Feet): 40 Feet Assistive device: Rolling walker (2 wheels) Gait Pattern/deviations: Step-through pattern, Decreased stride length, Wide base of support, Trunk flexed   Gait velocity interpretation: <1.8 ft/sec, indicate of risk for recurrent falls   General Gait Details: 40' x 2 trials. At 33' pt asking for somewhere to sit with HR 135 and pt not able to process cues for turning back to room, chair pulled to him. after seated rest pt able to walk back to room with wide BOS  Stairs            Wheelchair Mobility    Modified Rankin (Stroke Patients Only)       Balance Overall balance assessment: Needs assistance   Sitting balance-Leahy Scale: Fair Sitting balance - Comments: EOb without physical assist   Standing balance support: Bilateral upper extremity supported, Reliant on assistive device for balance Standing balance-Leahy Scale: Poor Standing balance comment: reliant on RW in  standing                             Pertinent Vitals/Pain Pain Assessment Pain Assessment: No/denies pain    Home Living  Family/patient expects to be discharged to:: Private residence Living Arrangements: Alone Available Help at Discharge: Friend(s);Available PRN/intermittently Type of Home: House         Home Layout: One level Home Equipment: None Additional Comments: Pt reports living in his own home with neighbors but then describes them as separate rooms but states he does not pay rent despite chart stating he was found at boarding house. No family present to provide further information    Prior Function Prior Level of Function : Independent/Modified Independent             Mobility Comments: pt reports he walks without AD ADLs Comments: pt states he takes the bus to [place] (doesn't drive or cook)     Hand Dominance        Extremity/Trunk Assessment   Upper Extremity Assessment Upper Extremity Assessment: Generalized weakness    Lower Extremity Assessment Lower Extremity Assessment: Generalized weakness    Cervical / Trunk Assessment Cervical / Trunk Assessment: Kyphotic  Communication   Communication: HOH  Cognition Arousal/Alertness: Awake/alert Behavior During Therapy: Flat affect Overall Cognitive Status: Impaired/Different from baseline Area of Impairment: Orientation, Attention, Memory, Following commands, Safety/judgement, Awareness                 Orientation Level: Disoriented to, Situation, Time Current Attention Level: Sustained Memory: Decreased short-term memory Following Commands: Follows one step commands inconsistently, Follows one step commands with increased time Safety/Judgement: Decreased awareness of deficits, Decreased awareness of safety Awareness: Intellectual   General Comments: pt impulsive during gait trying to sit and would not follow commands. Pt then returning to bed rather than chair and required repeated cues and attempts for pt to transition to chair with lack of awareness for safety and cues, pt grasping chair and turning 180  degrees        General Comments      Exercises     Assessment/Plan    PT Assessment Patient needs continued PT services  PT Problem List Decreased strength;Decreased mobility;Decreased safety awareness;Decreased activity tolerance;Decreased cognition;Decreased balance;Cardiopulmonary status limiting activity;Decreased knowledge of use of DME       PT Treatment Interventions Gait training;Functional mobility training;Therapeutic activities;Cognitive remediation;Patient/family education;Neuromuscular re-education;Balance training;Therapeutic exercise;DME instruction    PT Goals (Current goals can be found in the Care Plan section)  Acute Rehab PT Goals Patient Stated Goal: return home, watch tv PT Goal Formulation: With patient Time For Goal Achievement: 08/31/22 Potential to Achieve Goals: Fair    Frequency Min 2X/week     Co-evaluation               AM-PAC PT "6 Clicks" Mobility  Outcome Measure Help needed turning from your back to your side while in a flat bed without using bedrails?: A Little Help needed moving from lying on your back to sitting on the side of a flat bed without using bedrails?: A Little Help needed moving to and from a bed to a chair (including a wheelchair)?: A Little Help needed standing up from a chair using your arms (e.g., wheelchair or bedside chair)?: A Little Help needed to walk in hospital room?: A Lot Help needed climbing 3-5 steps with a railing? : Total 6 Click Score: 15    End of  Session Equipment Utilized During Treatment: Gait belt Activity Tolerance: Patient tolerated treatment well Patient left: in chair;with call bell/phone within reach;with chair alarm set;with nursing/sitter in room Nurse Communication: Mobility status PT Visit Diagnosis: Other abnormalities of gait and mobility (R26.89);Difficulty in walking, not elsewhere classified (R26.2);Muscle weakness (generalized) (M62.81)    Time: 7371-0626 PT Time Calculation  (min) (ACUTE ONLY): 25 min   Charges:   PT Evaluation $PT Eval Moderate Complexity: 1 Mod PT Treatments $Gait Training: 8-22 mins        Merryl Hacker, PT Acute Rehabilitation Services Office: [phone]   Enedina Finner Esten Dollar 08/17/2022, 12:27 PM

## 2022-08-17 NOTE — TOC Initial Note (Signed)
Transition of Care [facility]) - Initial/Assessment Note    Patient Details  Name: Thomas Rich MRN: [id number] Date of Birth: 1936-06-08  [facility] CM/SW Contact:    Bethann Berkshire, [place] Phone Number: 08/17/2022, 3:14 PM  Clinical Narrative:                  CSW met with pt to discuss PT recs. Pt lives in home in [place] neighborhood. with separate units. States he does not have any family in [place] or friends who help him. His son Thomas Rich is in [place] and his son Thomas Rich is in [place]. Pt is agreeable to SNF for rehab. He consents to [place] calling his sons if needed. CSW will complete fl2 and fax bed requests in hub.   Expected Discharge Plan: Skilled Nursing Facility Barriers to Discharge: Continued Medical Work up, SNF Pending bed offer   Patient Goals and CMS Choice        Expected Discharge Plan and Services Expected Discharge Plan: [place] arrangements for the past 2 months: Single Family Home                                      Prior Living Arrangements/Services Living arrangements for the past 2 months: Single Family Home Lives with:: Self Patient language and need for interpreter reviewed:: Yes Do you feel safe going back to the place where you live?: Yes      Need for Family Participation in Patient Care: Yes (Comment) Care giver support system in place?: No (comment)   Criminal Activity/Legal Involvement Pertinent to Current Situation/Hospitalization: No - Comment as needed  Activities of Daily Living      Permission Sought/Granted   Permission granted to share information with : Yes, Verbal Permission Granted  Share Information with NAME: Son Thomas Rich and Son Thomas Rich           Emotional Assessment Appearance:: Appears stated age Attitude/Demeanor/Rapport: Engaged Affect (typically observed): Accepting, Quiet Orientation: : Oriented to Self, Oriented to Place, Oriented to  Time, Oriented to  Situation Alcohol / Substance Use: Not Applicable Psych Involvement: No (comment)  Admission diagnosis:  Bradycardia [R00.1] Acute confusional state [F05] Unresponsiveness [R41.89] Patient Active Problem List   Diagnosis Date Noted   Acute metabolic encephalopathy 22/29/7989   Pancytopenia ([place]) 08/16/2022   Sinus bradycardia 08/16/2022   Hyperkalemia 08/16/2022   Physical debility 08/16/2022   Unresponsiveness 08/15/2022   BPH (benign prostatic hyperplasia) 04/26/2012   Dizziness - light-headed 04/26/2012   Sinusitis 04/26/2012   UTI (lower urinary tract infection) 04/26/2012   Hypotension 04/26/2012   Back pain 04/26/2012   Acute kidney injury ([place]) 04/26/2012   Hypokalemia 04/26/2012   PCP:  Patient, No Pcp Per Pharmacy:   [facility] [place], [place] [address] [place] [address] Phone: [phone] Fax: [phone]     Social Determinants of Health (SDOH) Interventions    Readmission Risk Interventions     No data to display

## 2022-08-17 NOTE — Progress Notes (Signed)
Daily Progress Note   Patient Name: Thomas Rich       Date: 08/17/2022 DOB: Jun 22, 1936  Age: 86 y.o. MRN#: [id number] Attending Physician: Aline August, MD Primary Care Physician: Patient, No Pcp Per Admit Date: 08/15/2022  Reason for Consultation/Follow-up: Establishing goals of care  Subjective: Medical records reviewed including progress notes, imaging. Patient assessed at the bedside.  He is alert and oriented x2.  Denies any pain or distress.  Provided patient with update on my conversations with his sons yesterday.  He is glad to hear that they have been contacted and is agreeable to this PA reaching out for further discussions.  I then called patient's son Thomas Rich to provide updates on the events of the evening including likely sundowning with agitated behaviors.  Counseled on the SNF referral process and that patient would likely be ready in the next few days given his improvement thus far.  Reviewed hospice philosophy and the importance of determining whether patient is refusing certain since like labs and imaging based on his overall goals of care or whether this is due to confusion.  Counseled that if patient continues to refuse certain interventions and his quality of life is not acceptable to him, he could always transition to hospice after SNF placement.  Questions and concerns addressed. PMT will continue to support holistically.   Length of Stay: 2   Physical Exam Vitals and nursing note reviewed.  Constitutional:      General: He is not in acute distress.    Appearance: He is ill-appearing.  Cardiovascular:     Rate and Rhythm: Normal rate.  Pulmonary:     Effort: Pulmonary effort is normal.  Neurological:     Mental Status: He is alert.     Comments: Oriented  x2  Psychiatric:        Behavior: Behavior is cooperative.            Vital Signs: BP 103/66 (BP Location: Right Arm)   Pulse (!) 135 Comment: ambulating  Temp 98.1 F (36.7 C) (Oral)   Resp 18   Ht 5\' 8"  (1.727 m)   Wt 65.8 kg   SpO2 100%   BMI 22.06 kg/m  SpO2: SpO2: 100 % O2 Device: O2 Device: Nasal Cannula O2 Flow Rate: O2 Flow Rate (L/min): 2 L/min  Palliative Assessment/Data:     Palliative Care Assessment & Plan   Patient Profile: 86 y.o. male  with past medical history of hypertension, BPH, bradycardia in 201,9 admitted on 08/15/2022 with unresponsiveness, found down by landlord.    Patient brought in by EMS who was able to get him to respond.  Admitted for ongoing work-up for bradycardia and acute metabolic encephalopathy.  PMT has been consulted to assist with goals of care conversation.  Assessment: Goals of care conversation Acute metabolic encephalopathy Possible dementia  Recommendations/Plan: Continue DNR Continue current care Son's goal is eventually transferring him to his home in [facility], he would be open to SNF placement if ready for discharge prior to his ability to visit Psychosocial and emotional support provided PMT will continue to follow and support   Prognosis: Guarded  Discharge Planning: [place] for rehab with Palliative care service follow-up  Care plan was discussed with patient, patient's son Thomas Rich   MDM high         [place], PA-C  Palliative Medicine Team Team phone # [phone]  Thank you for allowing the Palliative Medicine Team to assist in the care of this patient. Please utilize secure chat with additional questions, if there is no response within 30 minutes please call the above phone number.  Palliative Medicine Team providers are available by phone from 7am to 7pm daily and can be reached through the team cell phone.  Should this patient require assistance outside of these hours,  please call the patient's attending physician.

## 2022-08-17 NOTE — [place] FL2 (Signed)
[place] MEDICAID FL2 LEVEL OF CARE SCREENING TOOL     IDENTIFICATION  Patient Name: Thomas Rich Birthdate: [birth year] Sex: male Admission Date (Current Location): 08/15/2022  [facility] and [place] Number:      Facility and Address:  [facility], [address] [place], [place] [address]      Provider Number: [id number]  Attending Physician Name and Address:  Glade Lloyd, MD  Relative Name and Phone Number:  Detron, Carras (Son)   [phone] [facility])    Current Level of Care: Hospital Recommended Level of Care: Skilled Nursing Facility Prior Approval Number: [id number]  Date Approved/Denied:   PASRR Number: [id number]  Discharge Plan: Home    Current Diagnoses: Patient Active Problem List   Diagnosis Date Noted   Acute metabolic encephalopathy 08/16/2022   Pancytopenia (HCC) 08/16/2022   Sinus bradycardia 08/16/2022   Hyperkalemia 08/16/2022   Physical debility 08/16/2022   Unresponsiveness 08/15/2022   BPH (benign prostatic hyperplasia) 04/26/2012   Dizziness - light-headed 04/26/2012   Sinusitis 04/26/2012   UTI (lower urinary tract infection) 04/26/2012   Hypotension 04/26/2012   Back pain 04/26/2012   Acute kidney injury (HCC) 04/26/2012   Hypokalemia 04/26/2012    Orientation RESPIRATION BLADDER Height & Weight     Self, Time, Situation, Place  O2 (2LNC) Continent Weight: 145 lb 1 oz (65.8 kg) Height:  5\' 8"  (172.7 cm)  BEHAVIORAL SYMPTOMS/MOOD NEUROLOGICAL BOWEL NUTRITION STATUS      Continent Diet (see d/c summary)  AMBULATORY STATUS COMMUNICATION OF NEEDS Skin   Extensive Assist Verbally Normal                       Personal Care Assistance Level of Assistance  Bathing, Feeding, Dressing Bathing Assistance: Limited assistance Feeding assistance: Independent Dressing Assistance: Limited assistance     Functional Limitations Info  Sight, Hearing, Speech Sight Info: Adequate Hearing Info:  Impaired Speech Info: Adequate    SPECIAL CARE FACTORS FREQUENCY  PT (By licensed PT), OT (By licensed OT)     PT Frequency: 5x/week OT Frequency: 5x/week            Contractures Contractures Info: Not present    Additional Factors Info  Code Status, Allergies Code Status Info: DNR Allergies Info: Augmentin (amoxicillin-pot Clavulanate) Low           Current Medications (08/17/2022):  This is the current hospital active medication list Current Facility-Administered Medications  Medication Dose Route Frequency Provider Last Rate Last Admin   0.9 %  sodium chloride infusion   Intravenous Continuous 08/19/2022, DO 75 mL/hr at 08/17/22 0738 New Bag at 08/17/22 0738   acetaminophen (TYLENOL) tablet 650 mg  650 mg Oral Q6H PRN 08/19/22, DO       folic acid (FOLVITE) tablet 1 mg  1 mg Oral Daily Darlin Drop, Kshitiz, MD   1 mg at 08/17/22 0841   haloperidol lactate (HALDOL) injection 1-2 mg  1-2 mg Intravenous Q6H PRN 08/19/22, MD       ondansetron (ZOFRAN) injection 4 mg  4 mg Intravenous Q6H PRN Glade Lloyd, Carole N, DO       polyethylene glycol (MIRALAX / GLYCOLAX) packet 17 g  17 g Oral Daily PRN 01-31-1992 N, DO       thiamine (VITAMIN B1) 500 mg in normal saline (50 mL) IVPB  500 mg Intravenous Daily Dow Adolph, MD 100 mL/hr at 08/17/22 0841 500 mg at 08/17/22 0841  Discharge Medications: Please see discharge summary for a list of discharge medications.  Relevant Imaging Results:  Relevant Lab Results:   Additional Information SSN [id number]  Bethann Berkshire, LCSW

## 2022-08-17 NOTE — Progress Notes (Signed)
Pt hasn't urinated all night. Bladder scan revealed 230cc. On-call MD made aware. Pt denies feeling of having to pee and no discomfort.

## 2022-08-17 NOTE — Procedures (Signed)
Patient Name: Jayvian Escoe  MRN: [id number]  Epilepsy Attending: Lora Havens  Referring Physician/Provider: Aline August, MD  Date: 08/16/2022  Duration: 24.18 mins  Patient history: 86yo F with ams. EEG to evaluate for seizure  Level of alertness: Awake, asleep  AEDs during EEG study: None  Technical aspects: This EEG study was done with scalp electrodes positioned according to the 10-20 International system of electrode placement. Electrical activity was reviewed with band pass filter of 1-70Hz , sensitivity of 7 uV/mm, display speed of 70mm/sec with a 60Hz  notched filter applied as appropriate. EEG data were recorded continuously and digitally stored.  Video monitoring was available and reviewed as appropriate.  Description: The posterior dominant rhythm consists of 8Hz  activity of moderate voltage (25-35 uV) seen predominantly in posterior head regions, symmetric and reactive to eye opening and eye closing. Sleep was characterized by vertex waves, sleep spindles (12 to 14 Hz), maximal frontocentral region. EEG showed intermittent generalized 3 to 6 Hz theta-delta slowing. Hyperventilation and photic stimulation were not performed.     ABNORMALITY - Intermittent slow, generalized  IMPRESSION: This study is suggestive of mild diffuse encephalopathy, nonspecific etiology. No seizures or epileptiform discharges were seen throughout the recording.  Dastan Krider Barbra Sarks

## 2022-08-17 NOTE — Progress Notes (Signed)
PROGRESS NOTE    Thomas Rich  JGG:[id number] DOB: 12-03-1935 DOA: 08/15/2022 PCP: Patient, No Pcp Per   Brief Narrative:  86 year old male with history of hypertension, BPH, bradycardia and was on 19 presented after being found unresponsive in his boarding home by his landlord.  On presentation, he was still very somnolent with heart rate in the 40s and EKG showing sinus bradycardia.  Cardiology was consulted.  He was started on IV fluids.  CT of the head was negative for any acute abnormality.  Palliative care was also consulted.  Assessment & Plan:   Unresponsiveness/acute metabolic encephalopathy Possibly worsening dementia -CT of the head was negative for acute abnormality. -EEG report pending.  Patient apparently refused MRI of brain overnight.   -PT/OT evaluation.  Diet as per SLP recommendations.  Fall precaution.  Monitor mental status and do frequent neurochecks -Check vitamin B12, ammonia levels.  LFTs normal -Continue high-dose thiamine and oral folic acid. -Urinalysis pending. -As per palliative care discussion with patient's son, patient might have undiagnosed dementia since 2019.  Sinus bradycardia -Cardiology has signed off.  2D echo showed EF of 50 to 55% with grade 1 diastolic dysfunction.  Pancytopenia -Questionable cause.  Labs pending for today.  Repeat a.m. labs.  No signs of bleeding.  Might need hematology evaluation as an outpatient  Abnormal thyroid function test -TSH and T4 both elevated.  Will need repeat labs as an outpatient in 6 weeks time  Hyperkalemia -Resolved.  Labs pending for today.  Physical debility -PT/OT eval  Goals of care -Patient looks chronically deconditioned and very disheveled.  -Palliative care following: Patient has been made DNR.  Overall prognosis is very poor.  Son wants to continue current level of care but does not want to escalate care further if patient gets worse.  He is okay for SNF placement for the patient if  needed.    DVT prophylaxis: SCDs Code Status: Full Family Communication: None at bedside Disposition Plan: Status is: Inpatient Remains inpatient appropriate because: Of severity of illness  Consultants: Cardiology. palliative care. Procedures: Echo Antimicrobials: None   Subjective: Patient seen and examined at bedside.  Wakes up slightly, hardly answers any questions.  Poor historian.  No fever, vomiting, seizures reported by nursing staff.  Had issues with agitation overnight as per nursing staff. Objective: Vitals:   08/16/22 1718 08/16/22 1932 08/17/22 0018 08/17/22 0353  BP: 93/70 102/68 102/64 108/70  Pulse: (!) 58 (!) 45 (!) 57 (!) 58  Resp: 16 18 18 18   Temp: 97.6 F (36.4 C) 98.1 F (36.7 C) 98.1 F (36.7 C) 98.1 F (36.7 C)  TempSrc: Oral Oral Oral Oral  SpO2: 100% 100% 100% 100%  Weight: 64.4 kg  65.8 kg   Height: 5\' 8"  (1.727 m)       Intake/Output Summary (Last 24 hours) at 08/17/2022 0737 Last data filed at 08/17/2022 0400 Gross per 24 hour  Intake 222.83 ml  Output --  Net 222.83 ml   Filed Weights   08/16/22 1718 08/17/22 0018  Weight: 64.4 kg 65.8 kg    Examination:  General: On room air.  No distress ENT/neck: No thyromegaly.  JVD is not elevated  respiratory: Decreased breath sounds at bases bilaterally with some crackles; no wheezing  CVS: S1-S2 heard, mild intermittent bradycardia present  abdominal: Soft, nontender, slightly distended; no organomegaly, normal bowel sounds are heard Extremities: Trace lower extremity edema; no cyanosis  CNS: Wakes up slightly, hardly answers any questions, extremely slow to respond.  Extremely poor historian.  No focal neurologic deficit.  Moves extremities Lymph: No obvious lymphadenopathy Skin: No obvious ecchymosis/lesions  psych: Currently not agitated.  Affect is extremely flat.  Hardly participates in any conversation.   Musculoskeletal: No obvious joint swelling/deformity    Data Reviewed: I  have personally reviewed following labs and imaging studies  CBC: Recent Labs  Lab 08/15/22 1557 08/15/22 1621 08/16/22 0226  WBC 3.0*  --  3.3*  NEUTROABS 1.7  --  1.7  HGB 11.1* 11.6* 10.6*  HCT 31.4* 34.0* 30.9*  MCV 119.8*  --  122.6*  PLT 52*  --  50*    Basic Metabolic Panel: Recent Labs  Lab 08/15/22 1557 08/15/22 1621 08/16/22 0226  NA 135 135 138  K 4.5 5.4* 3.5  CL 99 99 100  CO2 26  --  25  GLUCOSE 94 88 85  BUN 18 26* 17  CREATININE 1.01 1.00 0.96  CALCIUM 8.6*  --  8.7*  MG  --   --  1.7  PHOS  --   --  3.3    GFR: Estimated Creatinine Clearance: 53.3 mL/min (by C-G formula based on SCr of 0.96 mg/dL). Liver Function Tests: Recent Labs  Lab 08/15/22 1557 08/16/22 0226  AST 34 26  ALT 18 19  ALKPHOS 44 42  BILITOT 3.0* 2.9*  PROT 6.4* 6.4*  ALBUMIN 3.3* 3.2*    No results for input(s): "LIPASE", "AMYLASE" in the last 168 hours. No results for input(s): "AMMONIA" in the last 168 hours. Coagulation Profile: No results for input(s): "INR", "PROTIME" in the last 168 hours. Cardiac Enzymes: No results for input(s): "CKTOTAL", "CKMB", "CKMBINDEX", "TROPONINI" in the last 168 hours. BNP (last 3 results) No results for input(s): "PROBNP" in the last 8760 hours. HbA1C: No results for input(s): "HGBA1C" in the last 72 hours. CBG: No results for input(s): "GLUCAP" in the last 168 hours. Lipid Profile: No results for input(s): "CHOL", "HDL", "LDLCALC", "TRIG", "CHOLHDL", "LDLDIRECT" in the last 72 hours. Thyroid Function Tests: Recent Labs    08/16/22 0226  TSH 5.195*  FREET4 1.15*    Anemia Panel: No results for input(s): "VITAMINB12", "FOLATE", "FERRITIN", "TIBC", "IRON", "RETICCTPCT" in the last 72 hours. Sepsis Labs: Recent Labs  Lab 08/15/22 1557  LATICACIDVEN 1.2     No results found for this or any previous visit (from the past 240 hour(s)).       Radiology Studies: DG Abd 1 View  Result Date: 08/16/2022 CLINICAL DATA:   Screening for MRI EXAM: ABDOMEN - 1 VIEW COMPARISON:  None Available. FINDINGS: Nonobstructive bowel-gas pattern. Calcifications in the pelvis, likely due to phleboliths. Skinfolds overlying the left upper quadrant and partially visualized left lower lung. Moderate degenerative changes of the lumbar spine. No metallic foreign body seen abdomen or pelvis. IMPRESSION: No metallic foreign body seen abdomen or pelvis. Electronically Signed   By: Allegra Lai M.D.   On: 08/16/2022 18:17   ECHOCARDIOGRAM COMPLETE  Result Date: 08/16/2022    ECHOCARDIOGRAM REPORT   Patient Name:   WILLMER FELLERS Date of Exam: 08/16/2022 Medical Rec #:  [id number]             Height:       68.0 in Accession #:    [id number]            Weight:       160.0 lb Date of Birth:  Jan 16, 1936             BSA:  1.859 m Patient Age:    33 years              BP:           114/75 mmHg Patient Gender: M                     HR:           63 bpm. Exam Location:  Inpatient Procedure: 2D Echo, Color Doppler and Cardiac Doppler Indications:    Abnormal ECG  History:        Patient has no prior history of Echocardiogram examinations.                 Arrythmias:Bradycardia; Risk Factors:Hypertension.  Sonographer:    Eartha Inch Referring Phys: [id number] [place]  Sonographer Comments: Technically difficult study due to poor echo windows. Image acquisition challenging due to patient body habitus, Image acquisition challenging due to respiratory motion and Image acquisition challenging due to uncooperative patient.  Pt somnolent during exam. IMPRESSIONS  1. Left ventricular ejection fraction, by estimation, is 50 to 55%. Left ventricular ejection fraction by PLAX is 50 %. The left ventricle has low normal function. The left ventricle has no regional wall motion abnormalities. There is mild left ventricular hypertrophy. Left ventricular diastolic parameters are consistent with Grade I diastolic dysfunction (impaired relaxation).  2.  Right ventricular systolic function is moderately reduced. The right ventricular size is normal.  3. The mitral valve is grossly normal. Trivial mitral valve regurgitation.  4. The aortic valve is tricuspid. Aortic valve regurgitation is trivial. Comparison(s): No prior Echocardiogram. FINDINGS  Left Ventricle: Left ventricular ejection fraction, by estimation, is 50 to 55%. Left ventricular ejection fraction by PLAX is 50 %. The left ventricle has low normal function. The left ventricle has no regional wall motion abnormalities. The left ventricular internal cavity size was normal in size. There is mild left ventricular hypertrophy. Left ventricular diastolic parameters are consistent with Grade I diastolic dysfunction (impaired relaxation). Indeterminate filling pressures. Right Ventricle: The right ventricular size is normal. No increase in right ventricular wall thickness. Right ventricular systolic function is moderately reduced. Left Atrium: Left atrial size was normal in size. Right Atrium: Right atrial size was normal in size. Pericardium: There is no evidence of pericardial effusion. Mitral Valve: The mitral valve is grossly normal. Trivial mitral valve regurgitation. Tricuspid Valve: The tricuspid valve is grossly normal. Tricuspid valve regurgitation is trivial. Aortic Valve: The aortic valve is tricuspid. Aortic valve regurgitation is trivial. Pulmonic Valve: The pulmonic valve was normal in structure. Pulmonic valve regurgitation is not visualized. Aorta: The aortic root and ascending aorta are structurally normal, with no evidence of dilitation. IAS/Shunts: No atrial level shunt detected by color flow Doppler.  LEFT VENTRICLE PLAX 2D LV EF:         Left            Diastology                ventricular     LV e' medial:    4.46 cm/s                ejection        LV E/e' medial:  12.6                fraction by     LV e' lateral:   6.31 cm/s  PLAX is 50      LV E/e' lateral: 8.9                 %. LVIDd:         2.90 cm LVIDs:         2.20 cm LV PW:         1.00 cm LV IVS:        1.20 cm LVOT diam:     2.20 cm LV SV:         62 LV SV Index:   33 LVOT Area:     3.80 cm  RIGHT VENTRICLE RV S prime:     7.29 cm/s TAPSE (M-mode): 0.9 cm LEFT ATRIUM           Index        RIGHT ATRIUM          Index LA diam:      3.50 cm 1.88 cm/m   RA Area:     9.19 cm LA Vol (A2C): 58.0 ml 31.20 ml/m  RA Volume:   21.10 ml 11.35 ml/m LA Vol (A4C): 31.7 ml 17.05 ml/m  AORTIC VALVE LVOT Vmax:   78.30 cm/s LVOT Vmean:  58.100 cm/s LVOT VTI:    0.163 m  AORTA Ao Root diam: 3.40 cm MITRAL VALVE MV Area (PHT): 3.36 cm    SHUNTS MV Decel Time: 226 msec    Systemic VTI:  0.16 m MV E velocity: 56.00 cm/s  Systemic Diam: 2.20 cm MV A velocity: 62.10 cm/s MV E/A ratio:  0.90 Zoila Shutter MD Electronically signed by Zoila Shutter MD Signature Date/Time: 08/16/2022/3:43:11 PM    Final    CT Head Wo Contrast  Result Date: 08/15/2022 CLINICAL DATA:  Mental status change after being found unresponsive by landlord EXAM: CT HEAD WITHOUT CONTRAST TECHNIQUE: Contiguous axial images were obtained from the base of the skull through the vertex without intravenous contrast. RADIATION DOSE REDUCTION: This exam was performed according to the departmental dose-optimization program which includes automated exposure control, adjustment of the mA and/or kV according to patient size and/or use of iterative reconstruction technique. COMPARISON:  CT head 03/14/2018 FINDINGS: Brain: No intracranial hemorrhage, mass effect, or evidence of acute infarct. No hydrocephalus. No extra-axial fluid collection. Generalized cerebral atrophy. Ill-defined hypoattenuation within the cerebral white matter is nonspecific but consistent with chronic small vessel ischemic disease. Vascular: No hyperdense vessel. Intracranial arterial calcification. Skull: No fracture or focal lesion. Sinuses/Orbits: No acute finding. Trace air-fluid level in the right maxillary  sinus, decreased from prior. Other: None. IMPRESSION: No acute intracranial abnormality. Age-related atrophy and chronic microvascular ischemic change. Electronically Signed   By: Minerva Fester M.D.   On: 08/15/2022 20:06   DG Chest Port 1 View  Result Date: 08/15/2022 CLINICAL DATA:  Altered mental status EXAM: PORTABLE CHEST 1 VIEW COMPARISON:  11/23/2018 FINDINGS: Low lung volumes. No acute airspace disease or pleural effusion. Borderline cardiac enlargement. Aortic atherosclerosis. No pneumothorax. IMPRESSION: No active disease. Low lung volumes. Chronic mild elevation of right diaphragm Electronically Signed   By: Jasmine Pang M.D.   On: 08/15/2022 17:51        Scheduled Meds:  folic acid  1 mg Oral Daily   Continuous Infusions:  sodium chloride Stopped (08/15/22 2343)   thiamine (VITAMIN B1) injection Stopped (08/16/22 1850)          Glade Lloyd, MD [facility] 08/17/2022, 7:37 AM

## 2022-08-17 NOTE — Evaluation (Signed)
Occupational Therapy Evaluation Patient Details Name: Thomas Rich MRN: [id number] DOB: [birth year] Today's Date: 08/17/2022   History of Present Illness 86 yo admitted 9/24 after found unresponsive at boarding house. Pt with bradycardia to 40s. PMhx: HOH, arthritis, BPH, hypotension, questionable dementia   Clinical Impression   Pt reports independence at baseline with ADLs and use of RW for functional mobility. Upon arrival, pt slid down chair, saturated in urine. Pt unaware. Pt impulsive with stand pivot transfer back to bed, needing min A for safety, mod A for bed mobility as pt attempting to lay with head toward foot of bed despite cues. Pt needing min -mod A for UB/LB dressing seated EOB. Pt disoriented to time and situation, however able to state he is at a hospital in [place], follows ~50% of commands. Pt presenting with impairments listed below, will follow acutely. Recommend SNF at d/c.     Recommendations for follow up therapy are one component of a multi-disciplinary discharge planning process, led by the attending physician.  Recommendations may be updated based on patient status, additional functional criteria and insurance authorization.   Follow Up Recommendations  Skilled nursing-short term rehab (<3 hours/day)    Assistance Recommended at Discharge Frequent or constant Supervision/Assistance  Patient can return home with the following A lot of help with walking and/or transfers;A lot of help with bathing/dressing/bathroom;Assistance with cooking/housework;Direct supervision/assist for medications management;Direct supervision/assist for financial management;Assist for transportation;Help with stairs or ramp for entrance    Functional Status Assessment  Patient has had a recent decline in their functional status and demonstrates the ability to make significant improvements in function in a reasonable and predictable amount of time.  Equipment Recommendations  None  recommended by OT (defer)    Recommendations for Other Services PT consult     Precautions / Restrictions Precautions Precautions: Fall;Other (comment) Precaution Comments: watch sats (could not get reading) and HR Restrictions Weight Bearing Restrictions: No      Mobility Bed Mobility Overal bed mobility: Needs Assistance Bed Mobility: Sit to Supine       Sit to supine: Mod assist   General bed mobility comments: pt with poor control of descent, plopping self onto bed and LOB to the R side, requiring max A to sit upright for gown change    Transfers Overall transfer level: Needs assistance Equipment used: 1 person hand held assist Transfers: Bed to chair/wheelchair/BSC, Sit to/from Stand Sit to Stand: Min assist           General transfer comment: min A for 180* turn      Balance Overall balance assessment: Needs assistance   Sitting balance-Leahy Scale: Poor Sitting balance - Comments: LOB to R side, can sit statically with fair balance   Standing balance support: Bilateral upper extremity supported, Reliant on assistive device for balance Standing balance-Leahy Scale: Poor                             ADL either performed or assessed with clinical judgement   ADL Overall ADL's : Needs assistance/impaired Eating/Feeding: Set up   Grooming: Minimal assistance   Upper Body Bathing: Moderate assistance   Lower Body Bathing: Moderate assistance   Upper Body Dressing : Moderate assistance Upper Body Dressing Details (indicate cue type and reason): to don gown Lower Body Dressing: Moderate assistance   Toilet Transfer: Minimal assistance;Regular Toilet;Stand-pivot   Toileting- Clothing Manipulation and Hygiene: Total assistance Toileting - Clothing Manipulation Details (indicate cue  type and reason): incontinence     Functional mobility during ADLs: Min guard General ADL Comments: min guard for safety as pt impulsively getting out of chair  despite cues     Vision   Vision Assessment?: No apparent visual deficits     Perception     Praxis      Pertinent Vitals/Pain Pain Assessment Pain Assessment: No/denies pain     Hand Dominance     Extremity/Trunk Assessment Upper Extremity Assessment Upper Extremity Assessment: Generalized weakness   Lower Extremity Assessment Lower Extremity Assessment: Defer to PT evaluation   Cervical / Trunk Assessment Cervical / Trunk Assessment: Kyphotic   Communication Communication Communication: HOH   Cognition Arousal/Alertness: Awake/alert Behavior During Therapy: Flat affect Overall Cognitive Status: Impaired/Different from baseline Area of Impairment: Orientation, Attention, Memory, Following commands, Safety/judgement, Awareness                 Orientation Level: Time, Situation (states he is at a hospital in [place]) Current Attention Level: Sustained Memory: Decreased short-term memory Following Commands: Follows one step commands inconsistently, Follows one step commands with increased time Safety/Judgement: Decreased awareness of deficits, Decreased awareness of safety Awareness: Intellectual   General Comments: pt impulsive with decreased command following, max cues for safety throughout, follows ~50% of commands     General Comments  pt soiled and falling out of chair upon arrival, floor area around chair soaked in urine; SpO2 100% on RA after transfer/dressing    Exercises     Shoulder Instructions      Home Living Family/patient expects to be discharged to:: Private residence Living Arrangements: Alone Available Help at Discharge: Friend(s);Available PRN/intermittently Type of Home: House       Home Layout: One level     Bathroom Shower/Tub: Teacher, early years/pre: Standard     Home Equipment: None   Additional Comments: Pt reports living in his own home with neighbors but then describes them as separate rooms but  states he does not pay rent despite chart stating he was found at boarding house. No family present to provide further information      Prior Functioning/Environment Prior Level of Function : Independent/Modified Independent             Mobility Comments: pt reports uisng RW for mobility ADLs Comments: pt states he takes the bus to [place] (doesn't drive or cook); reports ind with ADLs        OT Problem List: Decreased strength;Decreased range of motion;Impaired balance (sitting and/or standing);Decreased activity tolerance;Decreased cognition;Decreased safety awareness;Impaired vision/perception;Decreased knowledge of use of DME or AE      OT Treatment/Interventions: Self-care/ADL training;Therapeutic exercise;Neuromuscular education;Energy conservation;DME and/or AE instruction;Therapeutic activities;Patient/family education;Balance training;Cognitive remediation/compensation    OT Goals(Current goals can be found in the care plan section) Acute Rehab OT Goals Patient Stated Goal: none stated OT Goal Formulation: With patient Time For Goal Achievement: 08/31/22 Potential to Achieve Goals: Good ADL Goals Pt Will Perform Grooming: with supervision;standing Pt Will Perform Upper Body Dressing: with supervision;sitting;standing Pt Will Perform Lower Body Dressing: with supervision;sitting/lateral leans;sit to/from stand Pt Will Transfer to Toilet: with min assist;ambulating;regular height toilet  OT Frequency: Min 2X/week    Co-evaluation              AM-PAC OT "6 Clicks" Daily Activity     Outcome Measure Help from another person eating meals?: A Little Help from another person taking care of personal grooming?: A Little Help from another person toileting, which  includes using toliet, bedpan, or urinal?: A Lot Help from another person bathing (including washing, rinsing, drying)?: A Lot Help from another person to put on and taking off regular upper body clothing?:  A Lot Help from another person to put on and taking off regular lower body clothing?: A Lot 6 Click Score: 14   End of Session Nurse Communication: Mobility status  Activity Tolerance: Patient tolerated treatment well Patient left: in bed;with call bell/phone within reach;with bed alarm set  OT Visit Diagnosis: Unsteadiness on feet (R26.81);Muscle weakness (generalized) (M62.81);Other abnormalities of gait and mobility (R26.89)                Time: 1400-1420 OT Time Calculation (min): 20 min Charges:  OT General Charges $OT Visit: 1 Visit OT Evaluation $OT Eval Moderate Complexity: 1 [address] OTD, OTR/L Acute Rehab [phone]  Mayer Masker 08/17/2022, 3:15 PM

## 2022-08-18 DIAGNOSIS — R4189 Other symptoms and signs involving cognitive functions and awareness: Secondary | ICD-10-CM | POA: Diagnosis not present

## 2022-08-18 LAB — CBC WITH DIFFERENTIAL/PLATELET
Abs Immature Granulocytes: 0.01 10*3/uL (ref 0.00–0.07)
Basophils Absolute: 0 10*3/uL (ref 0.0–0.1)
Basophils Relative: 1 %
Eosinophils Absolute: 0.2 10*3/uL (ref 0.0–0.5)
Eosinophils Relative: 5 %
HCT: 24.3 % — ABNORMAL LOW (ref 39.0–52.0)
Hemoglobin: 8.7 g/dL — ABNORMAL LOW (ref 13.0–17.0)
Immature Granulocytes: 0 %
Lymphocytes Relative: 43 %
Lymphs Abs: 1.3 10*3/uL (ref 0.7–4.0)
MCH: 42.2 pg — ABNORMAL HIGH (ref 26.0–34.0)
MCHC: 35.8 g/dL (ref 30.0–36.0)
MCV: 118 fL — ABNORMAL HIGH (ref 80.0–100.0)
Monocytes Absolute: 0.1 10*3/uL (ref 0.1–1.0)
Monocytes Relative: 5 %
Neutro Abs: 1.4 10*3/uL — ABNORMAL LOW (ref 1.7–7.7)
Neutrophils Relative %: 46 %
Platelets: 34 10*3/uL — ABNORMAL LOW (ref 150–400)
RBC: 2.06 MIL/uL — ABNORMAL LOW (ref 4.22–5.81)
RDW: 13.4 % (ref 11.5–15.5)
WBC: 3 10*3/uL — ABNORMAL LOW (ref 4.0–10.5)
nRBC: 0.7 % — ABNORMAL HIGH (ref 0.0–0.2)

## 2022-08-18 LAB — COMPREHENSIVE METABOLIC PANEL
ALT: 15 U/L (ref 0–44)
AST: 21 U/L (ref 15–41)
Albumin: 2.7 g/dL — ABNORMAL LOW (ref 3.5–5.0)
Alkaline Phosphatase: 40 U/L (ref 38–126)
Anion gap: 9 (ref 5–15)
BUN: 15 mg/dL (ref 8–23)
CO2: 26 mmol/L (ref 22–32)
Calcium: 8.7 mg/dL — ABNORMAL LOW (ref 8.9–10.3)
Chloride: 105 mmol/L (ref 98–111)
Creatinine, Ser: 1.05 mg/dL (ref 0.61–1.24)
GFR, Estimated: >60 mL/min (ref >60)
Glucose, Bld: 94 mg/dL (ref 70–99)
Potassium: 3.3 mmol/L — ABNORMAL LOW (ref 3.5–5.1)
Sodium: 140 mmol/L (ref 135–145)
Total Bilirubin: 2.5 mg/dL — ABNORMAL HIGH (ref 0.3–1.2)
Total Protein: 5.4 g/dL — ABNORMAL LOW (ref 6.5–8.1)

## 2022-08-18 LAB — MAGNESIUM: Magnesium: 1.6 mg/dL — ABNORMAL LOW (ref 1.7–2.4)

## 2022-08-18 MED ORDER — MULTI-VITAMIN/MINERALS PO TABS: 1.0000 | ORAL_TABLET | Freq: Every day | ORAL | 2 refills | Status: AC

## 2022-08-18 MED ORDER — FOLIC ACID 1 MG PO TABS: 1.0000 mg | ORAL_TABLET | Freq: Every day | ORAL | 0 refills | Status: AC

## 2022-08-18 MED ORDER — TRAMADOL HCL 50 MG PO TABS: 50.0000 mg | ORAL_TABLET | Freq: Four times a day (QID) | ORAL | 0 refills | Status: DC | PRN

## 2022-08-18 MED ORDER — POTASSIUM CHLORIDE 20 MEQ PO PACK
40.0000 meq | PACK | Freq: Once | ORAL | Status: AC
  Administered 2022-08-18: 40 meq via ORAL
  Filled 2022-08-18: qty 2

## 2022-08-18 MED ORDER — MAGNESIUM SULFATE 2 GM/50ML IV SOLN
2.0000 g | Freq: Once | INTRAVENOUS | Status: AC
  Administered 2022-08-18: 2 g via INTRAVENOUS
  Filled 2022-08-18: qty 50

## 2022-08-18 MED ORDER — VITAMIN B-12 1000 MCG PO TABS: 1000.0000 ug | ORAL_TABLET | Freq: Every day | ORAL | Status: AC

## 2022-08-18 MED ORDER — TRAMADOL HCL 50 MG PO TABS: 50.0000 mg | ORAL_TABLET | Freq: Four times a day (QID) | ORAL | 0 refills | Status: AC | PRN

## 2022-08-18 NOTE — Discharge Summary (Signed)
Physician Discharge Summary  Thomas Rich HBZ:[id number] DOB: 1936/06/30 DOA: 08/15/2022  PCP: Renee Rival, FNP  Admit date: 08/15/2022 Discharge date: 08/18/2022  Admitted From: home Disposition:  SNF  Recommendations for Outpatient Follow-up:  Follow up with PCP in 1-2 weeks Outpatient follow-up with hematology for pancytopenia  Home Health: none Equipment/Devices: none  Discharge Condition: stable CODE STATUS: DNR Diet Orders (From admission, onward)     Start     Ordered   08/16/22 1530  DIET DYS 3 Room service appropriate? Yes; Fluid consistency: Thin  Diet effective now       Question Answer Comment  Room service appropriate? Yes   Fluid consistency: Thin      08/16/22 1530            HPI: Per admitting MD, 86 year old male with history of hypertension, BPH, bradycardia and was on 19 presented after being found unresponsive in his boarding home by his landlord.  On presentation, he was still very somnolent with heart rate in the 40s and EKG showing sinus bradycardia.  Cardiology was consulted.  He was started on IV fluids.  CT of the head was negative for any acute abnormality.  Palliative care was also consulted.  Hospital Course / Discharge diagnoses: Principal Problem:   Unresponsiveness Active Problems:   Acute metabolic encephalopathy   Pancytopenia (HCC)   Sinus bradycardia   Hyperkalemia   Physical debility  Principal problem Unresponsiveness/acute metabolic encephalopathy - Possibly worsening dementia. CT of the head was negative for acute abnormality.  MRI of the brain without acute abnormalities, it did show progression of atrophy since 2006, likely due to chronic microvascular ischemia.  EEG was unremarkable.  PT/OT recommending SNF, will be discharged in stable condition.  Mental status seems to be stable. As per palliative care discussion with patient's son, patient might have undiagnosed dementia since 2019.   Active  problems Sinus bradycardia-Cardiology consulted, feels like it is chronic, asymptomatic, has signed off.  2D echo showed EF of 50 to 55% with grade 1 diastolic dysfunction. Pancytopenia -Questionable cause. No signs of bleeding.  Might need hematology evaluation as an outpatient.  Abnormal thyroid function test -TSH and T4 both elevated, suggesting sick euthyroid.  Will need repeat labs as an outpatient in 6 weeks time Hyperkalemia -Resolved.   Physical debility -needs SNF Goals of care -Patient looks chronically deconditioned and very disheveled.  -Palliative care following: Patient has been made DNR.  Overall prognosis is very poor.  Son wants to continue current level of care but does not want to escalate care further if patient gets worse.  He is okay for SNF placement  Sepsis ruled out   Discharge Instructions   Allergies as of 08/18/2022       Reactions   Augmentin [amoxicillin-pot Clavulanate] Rash   Has patient had a PCN reaction causing immediate rash, facial/tongue/throat swelling, SOB or lightheadedness with hypotension: unknown Has patient had a PCN reaction causing severe rash involving mucus membranes or skin necrosis: unknown Has patient had a PCN reaction that required hospitalization unknown Has patient had a PCN reaction occurring within the last 10 years: unknown If all of the above answers are "NO", then may proceed with Cephalosporin use.        Medication List     STOP taking these medications    amLODipine 2.5 MG tablet Commonly known as: NORVASC   amLODipine 5 MG tablet Commonly known as: NORVASC   azithromycin 250 MG tablet Commonly known as: Zithromax Z-Pak  diclofenac sodium 1 % Gel Commonly known as: VOLTAREN   docusate sodium 100 MG capsule Commonly known as: COLACE   lidocaine 4 %   meloxicam 7.5 MG tablet Commonly known as: Mobic   potassium chloride 10 MEQ tablet Commonly known as: KLOR-CON   predniSONE 10 MG tablet Commonly  known as: DELTASONE       TAKE these medications    acetaminophen 325 MG tablet Commonly known as: Tylenol Take 2 tablets (650 mg total) by mouth every 6 (six) hours as needed.   cyanocobalamin 1000 MCG tablet Commonly known as: VITAMIN B12 Take 1 tablet (1,000 mcg total) by mouth daily.   folic acid 1 MG tablet Commonly known as: FOLVITE Take 1 tablet (1 mg total) by mouth daily. Start taking on: August 19, 2022   multivitamin with minerals tablet Take 1 tablet by mouth daily.   polyethylene glycol 17 g packet Commonly known as: MIRALAX / GLYCOLAX Take 17 g by mouth daily.   tamsulosin 0.4 MG Caps capsule Commonly known as: FLOMAX Take 1 capsule (0.4 mg total) by mouth daily.   traMADol 50 MG tablet Commonly known as: ULTRAM Take 1 tablet (50 mg total) by mouth every 6 (six) hours as needed.        Follow-up Information     [facility]. Go on 08/27/2022.   Specialty: Internal Medicine Why: @8 :40am Contact information: [address] [place] [id number] [phone]                Consultations: none  Procedures/Studies:  MR BRAIN WO CONTRAST  Result Date: 08/17/2022 CLINICAL DATA:  Delirium EXAM: MRI HEAD WITHOUT CONTRAST TECHNIQUE: Multiplanar, multiecho pulse sequences of the brain and surrounding structures were obtained without intravenous contrast. COMPARISON:  MRI head 04/10/2005 FINDINGS: Brain: Generalized atrophy has progressed. Progressive ventricular enlargement and cortical volume loss especially in the frontal and temporal lobes. Mild white matter changes with mild periventricular deep white matter hyperintensity bilaterally. Negative for acute infarct.  Negative for hemorrhage or mass. Vascular: Normal arterial flow voids. Skull and upper cervical spine: Negative Sinuses/Orbits: Mild mucosal edema paranasal sinuses. Left mastoid effusion. Right mastoid clear. Right cataract extraction Other: None  IMPRESSION: No acute abnormality Progression of atrophy since 2006. Mild white matter changes also with progression and most likely due to chronic microvascular ischemia. Electronically Signed   By: Marlan Palauharles  Clark M.D.   On: 08/17/2022 11:15   EEG adult  Result Date: 08/17/2022 Charlsie QuestYadav, Priyanka O, MD     08/17/2022  8:26 AM Patient Name: Eliseo SquiresSiosaia Cuadrado MRN: [id number] Epilepsy Attending: Charlsie QuestPriyanka O Yadav Referring Physician/Provider: Glade LloydAlekh, Kshitiz, MD Date: 08/16/2022 Duration: 24.18 mins Patient history: 86yo F with ams. EEG to evaluate for seizure Level of alertness: Awake, asleep AEDs during EEG study: None Technical aspects: This EEG study was done with scalp electrodes positioned according to the 10-20 International system of electrode placement. Electrical activity was reviewed with band pass filter of 1-70Hz , sensitivity of 7 uV/mm, display speed of 3930mm/sec with a 60Hz  notched filter applied as appropriate. EEG data were recorded continuously and digitally stored.  Video monitoring was available and reviewed as appropriate. Description: The posterior dominant rhythm consists of 8Hz  activity of moderate voltage (25-35 uV) seen predominantly in posterior head regions, symmetric and reactive to eye opening and eye closing. Sleep was characterized by vertex waves, sleep spindles (12 to 14 Hz), maximal frontocentral region. EEG showed intermittent generalized 3 to 6 Hz theta-delta slowing. Hyperventilation and  photic stimulation were not performed.   ABNORMALITY - Intermittent slow, generalized IMPRESSION: This study is suggestive of mild diffuse encephalopathy, nonspecific etiology. No seizures or epileptiform discharges were seen throughout the recording. Charlsie Quest   DG Abd 1 View  Result Date: 08/16/2022 CLINICAL DATA:  Screening for MRI EXAM: ABDOMEN - 1 VIEW COMPARISON:  None Available. FINDINGS: Nonobstructive bowel-gas pattern. Calcifications in the pelvis, likely due to phleboliths.  Skinfolds overlying the left upper quadrant and partially visualized left lower lung. Moderate degenerative changes of the lumbar spine. No metallic foreign body seen abdomen or pelvis. IMPRESSION: No metallic foreign body seen abdomen or pelvis. Electronically Signed   By: Allegra Lai M.D.   On: 08/16/2022 18:17   ECHOCARDIOGRAM COMPLETE  Result Date: 08/16/2022    ECHOCARDIOGRAM REPORT   Patient Name:   MERRIK PUEBLA Date of Exam: 08/16/2022 Medical Rec #:  [id number]             Height:       68.0 in Accession #:    [id number]            Weight:       160.0 lb Date of Birth:  January 26, 1936             BSA:          1.859 m Patient Age:    86 years              BP:           114/75 mmHg Patient Gender: M                     HR:           63 bpm. Exam Location:  Inpatient Procedure: 2D Echo, Color Doppler and Cardiac Doppler Indications:    Abnormal ECG  History:        Patient has no prior history of Echocardiogram examinations.                 Arrythmias:Bradycardia; Risk Factors:Hypertension.  Sonographer:    Milda Smart Referring Phys: [id number] CAROLE N HALL  Sonographer Comments: Technically difficult study due to poor echo windows. Image acquisition challenging due to patient body habitus, Image acquisition challenging due to respiratory motion and Image acquisition challenging due to uncooperative patient.  Pt somnolent during exam. IMPRESSIONS  1. Left ventricular ejection fraction, by estimation, is 50 to 55%. Left ventricular ejection fraction by PLAX is 50 %. The left ventricle has low normal function. The left ventricle has no regional wall motion abnormalities. There is mild left ventricular hypertrophy. Left ventricular diastolic parameters are consistent with Grade I diastolic dysfunction (impaired relaxation).  2. Right ventricular systolic function is moderately reduced. The right ventricular size is normal.  3. The mitral valve is grossly normal. Trivial mitral valve regurgitation.   4. The aortic valve is tricuspid. Aortic valve regurgitation is trivial. Comparison(s): No prior Echocardiogram. FINDINGS  Left Ventricle: Left ventricular ejection fraction, by estimation, is 50 to 55%. Left ventricular ejection fraction by PLAX is 50 %. The left ventricle has low normal function. The left ventricle has no regional wall motion abnormalities. The left ventricular internal cavity size was normal in size. There is mild left ventricular hypertrophy. Left ventricular diastolic parameters are consistent with Grade I diastolic dysfunction (impaired relaxation). Indeterminate filling pressures. Right Ventricle: The right ventricular size is normal. No increase in right ventricular wall thickness. Right ventricular systolic function is moderately reduced. Left  Atrium: Left atrial size was normal in size. Right Atrium: Right atrial size was normal in size. Pericardium: There is no evidence of pericardial effusion. Mitral Valve: The mitral valve is grossly normal. Trivial mitral valve regurgitation. Tricuspid Valve: The tricuspid valve is grossly normal. Tricuspid valve regurgitation is trivial. Aortic Valve: The aortic valve is tricuspid. Aortic valve regurgitation is trivial. Pulmonic Valve: The pulmonic valve was normal in structure. Pulmonic valve regurgitation is not visualized. Aorta: The aortic root and ascending aorta are structurally normal, with no evidence of dilitation. IAS/Shunts: No atrial level shunt detected by color flow Doppler.  LEFT VENTRICLE PLAX 2D LV EF:         Left            Diastology                ventricular     LV e' medial:    4.46 cm/s                ejection        LV E/e' medial:  12.6                fraction by     LV e' lateral:   6.31 cm/s                PLAX is 50      LV E/e' lateral: 8.9                %. LVIDd:         2.90 cm LVIDs:         2.20 cm LV PW:         1.00 cm LV IVS:        1.20 cm LVOT diam:     2.20 cm LV SV:         62 LV SV Index:   33 LVOT Area:      3.80 cm  RIGHT VENTRICLE RV S prime:     7.29 cm/s TAPSE (M-mode): 0.9 cm LEFT ATRIUM           Index        RIGHT ATRIUM          Index LA diam:      3.50 cm 1.88 cm/m   RA Area:     9.19 cm LA Vol (A2C): 58.0 ml 31.20 ml/m  RA Volume:   21.10 ml 11.35 ml/m LA Vol (A4C): 31.7 ml 17.05 ml/m  AORTIC VALVE LVOT Vmax:   78.30 cm/s LVOT Vmean:  58.100 cm/s LVOT VTI:    0.163 m  AORTA Ao Root diam: 3.40 cm MITRAL VALVE MV Area (PHT): 3.36 cm    SHUNTS MV Decel Time: 226 msec    Systemic VTI:  0.16 m MV E velocity: 56.00 cm/s  Systemic Diam: 2.20 cm MV A velocity: 62.10 cm/s MV E/A ratio:  0.90 Zoila Shutter MD Electronically signed by Zoila Shutter MD Signature Date/Time: 08/16/2022/3:43:11 PM    Final    CT Head Wo Contrast  Result Date: 08/15/2022 CLINICAL DATA:  Mental status change after being found unresponsive by landlord EXAM: CT HEAD WITHOUT CONTRAST TECHNIQUE: Contiguous axial images were obtained from the base of the skull through the vertex without intravenous contrast. RADIATION DOSE REDUCTION: This exam was performed according to the departmental dose-optimization program which includes automated exposure control, adjustment of the mA and/or kV according to patient size and/or use of iterative reconstruction technique. COMPARISON:  CT  head 03/14/2018 FINDINGS: Brain: No intracranial hemorrhage, mass effect, or evidence of acute infarct. No hydrocephalus. No extra-axial fluid collection. Generalized cerebral atrophy. Ill-defined hypoattenuation within the cerebral white matter is nonspecific but consistent with chronic small vessel ischemic disease. Vascular: No hyperdense vessel. Intracranial arterial calcification. Skull: No fracture or focal lesion. Sinuses/Orbits: No acute finding. Trace air-fluid level in the right maxillary sinus, decreased from prior. Other: None. IMPRESSION: No acute intracranial abnormality. Age-related atrophy and chronic microvascular ischemic change. Electronically  Signed   By: Minerva Fester M.D.   On: 08/15/2022 20:06   DG Chest Port 1 View  Result Date: 08/15/2022 CLINICAL DATA:  Altered mental status EXAM: PORTABLE CHEST 1 VIEW COMPARISON:  11/23/2018 FINDINGS: Low lung volumes. No acute airspace disease or pleural effusion. Borderline cardiac enlargement. Aortic atherosclerosis. No pneumothorax. IMPRESSION: No active disease. Low lung volumes. Chronic mild elevation of right diaphragm Electronically Signed   By: Jasmine Pang M.D.   On: 08/15/2022 17:51   DG Lumbar Spine Complete  Result Date: 07/28/2022 CLINICAL DATA:  Fall. Loss of balance while getting off the bus. Endorses back pain EXAM: LUMBAR SPINE - COMPLETE 4+ VIEW COMPARISON:  04/26/2012 FINDINGS: Unchanged appearance of 6 mm anterolisthesis of L5 on S1. Mild to moderate vacuum disc noted at L2-3. Bilateral lower lumbar facet arthropathy noted. Chronic pars defects are again noted at the L5 level. Multilevel disc space narrowing and endplate spurring is noted throughout the visualized portions of the lower thoracic and lumbar spine. No signs of acute fracture or dislocation. IMPRESSION: 1. No acute findings. 2. Stable appearance of 6 mm anterolisthesis of L5 on S1 secondary to chronic bilateral pars defects. 3. Multilevel lumbar degenerative disc disease and facet arthropathy. Electronically Signed   By: Signa Kell M.D.   On: 07/28/2022 11:20   DG Hip Unilat W or Wo Pelvis 2-3 Views Right  Result Date: 07/28/2022 CLINICAL DATA:  Status post fall, denies neck pain EXAM: DG HIP (WITH OR WITHOUT PELVIS) 2-3V RIGHT COMPARISON:  None Available. FINDINGS: No acute fracture or dislocation. No aggressive osseous lesion. Normal alignment. Generalized osteopenia. Soft tissue are unremarkable. No radiopaque foreign body or soft tissue emphysema. IMPRESSION: 1. No acute osseous injury of the right hip. Given the patient's age and osteopenia, if there is persistent clinical concern for an occult hip fracture,  a MRI of the hip is recommended for increased sensitivity. Electronically Signed   By: Elige Ko M.D.   On: 07/28/2022 11:19     Subjective: - no chest pain, shortness of breath, no abdominal pain, nausea or vomiting.   Discharge Exam: BP 119/82 (BP Location: Right Arm)   Pulse 72   Temp 98.3 F (36.8 C) (Oral)   Resp 18   Ht 5\' 8"  (1.727 m)   Wt 66 kg   SpO2 98%   BMI 22.12 kg/m   General: Pt is alert, awake, not in acute distress Cardiovascular: RRR, S1/S2 +, no rubs, no gallops Respiratory: CTA bilaterally, no wheezing, no rhonchi Abdominal: Soft, NT, ND, bowel sounds + Extremities: no edema, no cyanosis    The results of significant diagnostics from this hospitalization (including imaging, microbiology, ancillary and laboratory) are listed below for reference.     Microbiology: Recent Results (from the past 240 hour(s))  Culture, blood (routine x 2)     Status: None (Preliminary result)   Collection Time: 08/15/22  5:26 PM   Specimen: BLOOD LEFT HAND  Result Value Ref Range Status   Specimen Description  BLOOD LEFT HAND  Final   Special Requests   Final    AEROBIC BOTTLE ONLY Blood Culture results may not be optimal due to an inadequate volume of blood received in culture bottles   Culture   Final    NO GROWTH 3 DAYS Performed at [facility], [address] [place], [place] [address]    Report Status PENDING  Incomplete  Culture, blood (routine x 2)     Status: None (Preliminary result)   Collection Time: 08/16/22  2:12 AM   Specimen: BLOOD  Result Value Ref Range Status   Specimen Description BLOOD LEFT ARM  Final   Special Requests   Final    BOTTLES DRAWN AEROBIC AND ANAEROBIC Blood Culture adequate volume   Culture   Final    NO GROWTH 2 DAYS Performed at [facility], [address] [place], [place] [address]    Report Status PENDING  Incomplete     Labs: Basic Metabolic Panel: Recent Labs  Lab 08/15/22 1557 08/15/22 1621  08/16/22 0226 08/18/22 0130  NA 135 135 138 140  K 4.5 5.4* 3.5 3.3*  CL 99 99 100 105  CO2 26  --  25 26  GLUCOSE 94 88 85 94  BUN 18 26* 17 15  CREATININE 1.01 1.00 0.96 1.05  CALCIUM 8.6*  --  8.7* 8.7*  MG  --   --  1.7 1.6*  PHOS  --   --  3.3  --    Liver Function Tests: Recent Labs  Lab 08/15/22 1557 08/16/22 0226 08/18/22 0130  AST 34 26 21  ALT ALKPHOS 44 42 40  BILITOT 3.0* 2.9* 2.5*  PROT 6.4* 6.4* 5.4*  ALBUMIN 3.3* 3.2* 2.7*   CBC: Recent Labs  Lab 08/15/22 1557 08/15/22 1621 08/16/22 0226 08/18/22 0130  WBC 3.0*  --  3.3* 3.0*  NEUTROABS 1.7  --  1.7 1.4*  HGB 11.1* 11.6* 10.6* 8.7*  HCT 31.4* 34.0* 30.9* 24.3*  MCV 119.8*  --  122.6* 118.0*  PLT 52*  --  50* 34*   CBG: No results for input(s): "GLUCAP" in the last 168 hours. Hgb A1c No results for input(s): "HGBA1C" in the last 72 hours. Lipid Profile No results for input(s): "CHOL", "HDL", "LDLCALC", "TRIG", "CHOLHDL", "LDLDIRECT" in the last 72 hours. Thyroid function studies Recent Labs    08/16/22 0226  TSH 5.195*   Urinalysis    Component Value Date/Time   COLORURINE STRAW (A) 06/22/2019 1200   APPEARANCEUR CLEAR 06/22/2019 1200   LABSPEC 1.004 (L) 06/22/2019 1200   PHURINE 7.0 06/22/2019 1200   GLUCOSEU NEGATIVE 06/22/2019 1200   HGBUR NEGATIVE 06/22/2019 1200   BILIRUBINUR NEGATIVE 06/22/2019 1200   KETONESUR NEGATIVE 06/22/2019 1200   PROTEINUR NEGATIVE 06/22/2019 1200   UROBILINOGEN 0.2 04/26/2012 1116   NITRITE NEGATIVE 06/22/2019 1200   LEUKOCYTESUR NEGATIVE 06/22/2019 1200    FURTHER DISCHARGE INSTRUCTIONS:   Get Medicines reviewed and adjusted: Please take all your medications with you for your next visit with your Primary MD   Laboratory/radiological data: Please request your Primary MD to go over all hospital tests and procedure/radiological results at the follow up, please ask your Primary MD to get all Hospital records sent to his/her office.   In  some cases, they will be blood work, cultures and biopsy results pending at the time of your discharge. Please request that your primary care M.D. goes through all the records of  your hospital data and follows up on these results.   Also Note the following: If you experience worsening of your admission symptoms, develop shortness of breath, life threatening emergency, suicidal or homicidal thoughts you must seek medical attention immediately by calling 911 or calling your MD immediately  if symptoms less severe.   You must read complete instructions/literature along with all the possible adverse reactions/side effects for all the Medicines you take and that have been prescribed to you. Take any new Medicines after you have completely understood and accpet all the possible adverse reactions/side effects.    Do not drive when taking Pain medications or sleeping medications (Benzodaizepines)   Do not take more than prescribed Pain, Sleep and Anxiety Medications. It is not advisable to combine anxiety,sleep and pain medications without talking with your primary care practitioner   Special Instructions: If you have smoked or chewed Tobacco  in the last 2 yrs please stop smoking, stop any regular Alcohol  and or any Recreational drug use.   Wear Seat belts while driving.   Please note: You were cared for by a hospitalist during your hospital stay. Once you are discharged, your primary care physician will handle any further medical issues. Please note that NO REFILLS for any discharge medications will be authorized once you are discharged, as it is imperative that you return to your primary care physician (or establish a relationship with a primary care physician if you do not have one) for your post hospital discharge needs so that they can reassess your need for medications and monitor your lab values.  Time coordinating discharge: 40 minutes  SIGNED:  Pamella Pert, MD, PhD 08/18/2022, 11:05  AM

## 2022-08-18 NOTE — Progress Notes (Signed)
Report called to [facility] RN.  

## 2022-08-18 NOTE — TOC Transition Note (Signed)
Transition of Care [facility]) - CM/SW Discharge Note   Patient Details  Name: Thomas Rich MRN: [id number] Date of Birth: 07-30-1936  Transition of Care [facility]) CM/SW Contact:  Thomas Rich, Thomas Rich Phone Number: 08/18/2022, 2:24 PM   Clinical Narrative:     Patient will DC to: [facility] SNF Anticipated DC date: 08/18/22 Family notified: Son Thomas Rich Transport by: Thomas Rich   Per MD patient ready for DC to [place]. RN, patient, patient's family, and facility notified of DC. Discharge Summary and FL2 sent to facility. RN to call report prior to discharge ([phone] Room 220). DC packet on chart. Ambulance transport requested for patient.   CSW will sign off for now as social work intervention is no longer needed. Please consult [place] again if new needs arise.   Final next level of care: Skilled Nursing Facility Barriers to Discharge: No Barriers Identified   Patient Goals and CMS Choice        Discharge Placement              Patient chooses bed at: [facility] Patient to be transferred to facility by: [facility] Name of family member notified: Son Thomas Rich Patient and family notified of of transfer: 08/18/22  Discharge Plan and Services                                     Social Determinants of Health (SDOH) Interventions     Readmission Risk Interventions     No data to display

## 2022-08-18 NOTE — Care Management Important Message (Signed)
Important Message  Patient Details  Name: Thomas Rich MRN: [id number] Date of Birth: [birth year]   Medicare Important Message Given:  Yes     Alexi Geibel 08/18/2022, 1:23 PM

## 2022-08-18 NOTE — Progress Notes (Signed)
Pt's mag 1.6 and potassium 3.3. MD made aware. Pt has no complaints at this time. Pt resting comfortably in bed with call bell in reach.

## 2022-08-18 NOTE — TOC Progression Note (Signed)
Transition of Care [facility]) - Progression Note    Patient Details  Name: Kabe Mckoy MRN: [id number] Date of Birth: August 09, 1936  Transition of Care [facility]) CM/SW [place],  Phone Number: 08/18/2022, 1:31 PM  Clinical Narrative:     CSW met with pt and provided SNF bed offers. CSW called pt's son Izola Price on speaker phone to discuss choice with pt. Pt agrees for pt son to review SNF offers and call CSW back with a choice.   CSW received call back shortly after. Pt's son chooses [facility]. CSW explained that pt is stable for DC today. CSW will coordinate DC. Son will be arriving to [place] from [place] tomorrow.   CSW called and confirmed bed with [facility] today. Awaiting [facility] to notify CSW when they are ready to admit pt.   Expected Discharge Plan: [place] Barriers to Discharge: No Barriers Identified  Expected Discharge Plan and Services Expected Discharge Plan: [place] arrangements for the past 2 months: Single Family Home Expected Discharge Date: 08/18/22                                     Social Determinants of Health (SDOH) Interventions    Readmission Risk Interventions     No data to display

## 2022-08-18 NOTE — Progress Notes (Addendum)
Daily Progress Note   Patient Name: Thomas Rich       Date: 08/18/2022 DOB: 1936-02-25  Age: 86 y.o. MRN#: [id number] Attending Physician: Leatha Gilding, MD Primary Care Physician: Donell Beers, FNP Admit Date: 08/15/2022  Reason for Consultation/Follow-up: Establishing goals of care  Subjective: Medical records reviewed. Patient assessed at the bedside.  He is resting comfortably.  Noted plans for discharge today. Will request referral for outpatient palliative care to follow at SNF as discussed with his son Thomas Rich.  PMT will continue to support holistically.   Length of Stay: 3   Physical Exam Vitals and nursing note reviewed.  Constitutional:      General: He is sleeping. He is not in acute distress.    Appearance: He is ill-appearing.  Cardiovascular:     Rate and Rhythm: Bradycardia present.  Pulmonary:     Effort: Pulmonary effort is normal.  Psychiatric:        Behavior: Behavior is cooperative.    Vital Signs: BP 117/63 (BP Location: Right Arm)   Pulse (!) 56   Temp 98 F (36.7 C) (Oral)   Resp 18   Ht 5\' 8"  (1.727 m)   Wt 66 kg   SpO2 97%   BMI 22.12 kg/m  SpO2: SpO2: 97 % O2 Device: O2 Device: Room Air O2 Flow Rate: O2 Flow Rate (L/min): 2 L/min      Palliative Assessment/Data:   Palliative Care Assessment & Plan   Patient Profile: 86 y.o. male  with past medical history of hypertension, BPH, bradycardia in 201,9 admitted on 08/15/2022 with unresponsiveness, found down by landlord.    Patient brought in by EMS who was able to get him to respond.  Admitted for ongoing work-up for bradycardia and acute metabolic encephalopathy.  PMT has been consulted to assist with goals of care conversation.  Assessment: Goals of care  conversation Acute metabolic encephalopathy Possible dementia  Recommendations/Plan: Continue DNR Continue current care TOC assistance appreciated with referral to outpatient palliative PMT remains available acutely as needed   Prognosis: Guarded  Discharge Planning: Skilled Nursing Facility for rehab with Palliative care service follow-up   Total time: I spent 25 minutes in the care of the patient today in the above activities and documenting the encounter.   08/17/2022, PA-C Palliative Medicine Team Team  phone # [phone]  Thank you for allowing the Palliative Medicine Team to assist in the care of this patient. Please utilize secure chat with additional questions, if there is no response within 30 minutes please call the above phone number.  Palliative Medicine Team providers are available by phone from 7am to 7pm daily and can be reached through the team cell phone.  Should this patient require assistance outside of these hours, please call the patient's attending physician.

## 2022-08-20 LAB — CULTURE, BLOOD (ROUTINE X 2): Culture: NO GROWTH

## 2022-08-21 LAB — CULTURE, BLOOD (ROUTINE X 2)
Culture: NO GROWTH
Special Requests: ADEQUATE

## 2022-08-25 ENCOUNTER — Emergency Department (HOSPITAL_COMMUNITY): Payer: Medicare Other

## 2022-08-25 ENCOUNTER — Other Ambulatory Visit: Payer: Self-pay

## 2022-08-25 ENCOUNTER — Emergency Department (HOSPITAL_COMMUNITY)
Admission: EM | Admit: 2022-08-25 | Discharge: 2022-08-26 | Disposition: A | Discharge status: Home / Self Care | Payer: Medicare Other | Attending: Emergency Medicine | Admitting: Emergency Medicine

## 2022-08-25 ENCOUNTER — Encounter (HOSPITAL_COMMUNITY): Payer: Self-pay | Admitting: *Deleted

## 2022-08-25 DIAGNOSIS — W19XXXA Unspecified fall, initial encounter: Secondary | ICD-10-CM | POA: Insufficient documentation

## 2022-08-25 DIAGNOSIS — M25511 Pain in right shoulder: Secondary | ICD-10-CM | POA: Diagnosis not present

## 2022-08-25 DIAGNOSIS — I10 Essential (primary) hypertension: Secondary | ICD-10-CM | POA: Diagnosis not present

## 2022-08-25 DIAGNOSIS — M79602 Pain in left arm: Secondary | ICD-10-CM | POA: Insufficient documentation

## 2022-08-25 DIAGNOSIS — I6782 Cerebral ischemia: Secondary | ICD-10-CM | POA: Diagnosis not present

## 2022-08-25 LAB — CBC WITH DIFFERENTIAL/PLATELET
Abs Immature Granulocytes: 0.03 10*3/uL (ref 0.00–0.07)
Basophils Absolute: 0 10*3/uL (ref 0.0–0.1)
Basophils Relative: 0 %
Eosinophils Absolute: 0 10*3/uL (ref 0.0–0.5)
Eosinophils Relative: 0 %
HCT: 35.6 % — ABNORMAL LOW (ref 39.0–52.0)
Hemoglobin: 12.8 g/dL — ABNORMAL LOW (ref 13.0–17.0)
Immature Granulocytes: 0 %
Lymphocytes Relative: 8 %
Lymphs Abs: 0.6 10*3/uL — ABNORMAL LOW (ref 0.7–4.0)
MCH: 41.4 pg — ABNORMAL HIGH (ref 26.0–34.0)
MCHC: 36 g/dL (ref 30.0–36.0)
MCV: 115.2 fL — ABNORMAL HIGH (ref 80.0–100.0)
Monocytes Absolute: 1.2 10*3/uL — ABNORMAL HIGH (ref 0.1–1.0)
Monocytes Relative: 17 %
Neutro Abs: 5.6 10*3/uL (ref 1.7–7.7)
Neutrophils Relative %: 75 %
Platelets: 112 10*3/uL — ABNORMAL LOW (ref 150–400)
RBC: 3.09 MIL/uL — ABNORMAL LOW (ref 4.22–5.81)
RDW: 13.8 % (ref 11.5–15.5)
WBC: 7.5 10*3/uL (ref 4.0–10.5)
nRBC: 0 % (ref 0.0–0.2)

## 2022-08-25 LAB — BASIC METABOLIC PANEL
Anion gap: 8 (ref 5–15)
BUN: 14 mg/dL (ref 8–23)
CO2: 26 mmol/L (ref 22–32)
Calcium: 8.7 mg/dL — ABNORMAL LOW (ref 8.9–10.3)
Chloride: 101 mmol/L (ref 98–111)
Creatinine, Ser: 1.02 mg/dL (ref 0.61–1.24)
GFR, Estimated: >60 mL/min (ref >60)
Glucose, Bld: 124 mg/dL — ABNORMAL HIGH (ref 70–99)
Potassium: 4 mmol/L (ref 3.5–5.1)
Sodium: 135 mmol/L (ref 135–145)

## 2022-08-25 MED ORDER — FENTANYL CITRATE PF 50 MCG/ML IJ SOSY: 12.5000 ug | PREFILLED_SYRINGE | Freq: Once | INTRAMUSCULAR | Status: DC

## 2022-08-25 MED ORDER — HYDROCODONE-ACETAMINOPHEN 5-325 MG PO TABS
1.0000 | ORAL_TABLET | Freq: Once | ORAL | Status: DC
  Filled 2022-08-25: qty 1

## 2022-08-25 NOTE — ED Provider Notes (Signed)
[facility] EMERGENCY DEPARTMENT Provider Note   CSN: [id number] Arrival date & time: 08/25/22  1914     History  Chief Complaint  Patient presents with   Arm Pain    Thomas Rich is a 86 y.o. male with a past medical history of hypokalemia and multiple falls presenting today from his SNF for a shoulder fracture.  Patient tells me that he fell down last month and was seen here in the emergency department.  Reports that he also fell down today.  Does not provide any further history.  Paperwork at bedside says that the patient has been complaining of left upper extremity discomfort.  He told them it been present for a long time.  His left shoulder x-ray impression reads as follows "there is a fracture involving humeral neck with impaction without displacement.  Shoulder joint is grossly intact."   Arm Pain       Home Medications Prior to Admission medications   Medication Sig Start Date End Date Taking? Authorizing Provider  acetaminophen (TYLENOL) 325 MG tablet Take 2 tablets (650 mg total) by mouth every 6 (six) hours as needed. 06/22/19   Pricilla Loveless, MD  cyanocobalamin (VITAMIN B12) 1000 MCG tablet Take 1 tablet (1,000 mcg total) by mouth daily. 08/18/22   Leatha Gilding, MD  folic acid (FOLVITE) 1 MG tablet Take 1 tablet (1 mg total) by mouth daily. 08/19/22   Leatha Gilding, MD  Multiple Vitamins-Minerals (MULTIVITAMIN WITH MINERALS) tablet Take 1 tablet by mouth daily. 08/18/22 08/18/23  Leatha Gilding, MD  polyethylene glycol (MIRALAX / GLYCOLAX) 17 g packet Take 17 g by mouth daily. 06/22/19   Pricilla Loveless, MD  tamsulosin (FLOMAX) 0.4 MG CAPS capsule Take 1 capsule (0.4 mg total) by mouth daily. 06/22/19   Pricilla Loveless, MD  traMADol (ULTRAM) 50 MG tablet Take 1 tablet (50 mg total) by mouth every 6 (six) hours as needed. 08/18/22   Leatha Gilding, MD      Allergies    Augmentin [amoxicillin-pot clavulanate]    Review of Systems    Review of Systems  Physical Exam Updated Vital Signs BP (!) 140/92 (BP Location: Right Arm)   Pulse 77   Temp 98.6 F (37 C) (Oral)   Resp 18   Ht 5\' 8"  (1.727 m)   Wt 66 kg   SpO2 99%   BMI 22.12 kg/m  Physical Exam Vitals and nursing note reviewed.  Constitutional:      Appearance: Normal appearance.  HENT:     Head: Normocephalic and atraumatic.  Eyes:     General: No scleral icterus.    Conjunctiva/sclera: Conjunctivae normal.  Pulmonary:     Effort: Pulmonary effort is normal. No respiratory distress.  Musculoskeletal:     Comments: TTP over left and right shoulders.  Further TTP over left humerus, elbow and wrist  Skin:    General: Skin is warm and dry.     Capillary Refill: Capillary refill takes less than 2 seconds.     Findings: No bruising or rash.  Neurological:     Mental Status: He is alert.  Psychiatric:        Mood and Affect: Mood normal.     ED Results / Procedures / Treatments   Labs (all labs ordered are listed, but only abnormal results are displayed) Labs Reviewed - No data to display  EKG EKG Interpretation  Date/Time:  Wednesday August 25 2022 20:13:05 EDT Ventricular Rate:  76 PR  Interval:  177 QRS Duration: 102 QT Interval:  381 QTC Calculation: 429 R Axis:   -83 Text Interpretation: Sinus rhythm Left anterior fascicular block Low voltage, precordial leads Consider anterior infarct Confirmed by Alvino Blood ([id number]) on 08/25/2022 8:28:11 PM  Radiology DG Shoulder Left  Result Date: 08/25/2022 CLINICAL DATA:  Recent fall with left shoulder pain, initial encounter EXAM: LEFT SHOULDER - 2+ VIEW COMPARISON:  None Available. FINDINGS: No acute fracture or dislocation is noted. Severe degenerative changes of the glenohumeral joint are seen with remodeling of the humeral head. No soft tissue abnormality is noted. IMPRESSION: Degenerative changes without acute abnormality. Electronically Signed   By: Alcide Clever M.D.   On: 08/25/2022  22:02   CT Cervical Spine Wo Contrast  Result Date: 08/25/2022 CLINICAL DATA:  Status post fall. EXAM: CT CERVICAL SPINE WITHOUT CONTRAST TECHNIQUE: Multidetector CT imaging of the cervical spine was performed without intravenous contrast. Multiplanar CT image reconstructions were also generated. RADIATION DOSE REDUCTION: This exam was performed according to the departmental dose-optimization program which includes automated exposure control, adjustment of the mA and/or kV according to patient size and/or use of iterative reconstruction technique. COMPARISON:  August 16, 2018 FINDINGS: Alignment: There is mild reversal of the normal cervical spine lordosis. Skull base and vertebrae: No acute fracture. No primary bone lesion or focal pathologic process. Soft tissues and spinal canal: No prevertebral fluid or swelling. No visible canal hematoma. Disc levels: Marked severity endplate sclerosis is seen at the levels of C3-C4, C4-C5, C5-C6 and C6-C7. Mild to moderate severity anterior osteophyte formation and posterior bony spurring are also seen at C5-C6 and C6-C7. There is mild narrowing of the anterior atlantoaxial articulation. Marked severity intervertebral disc space narrowing is seen at C3-C4, C4-C5, C5-C6 and C6-C7. Bilateral marked severity multilevel facet joint hypertrophy is noted. Upper chest: Negative. Other: None. IMPRESSION: 1. No acute fracture or subluxation in the cervical spine. 2. Marked severity multilevel degenerative changes, as described above. Electronically Signed   By: Aram Candela M.D.   On: 08/25/2022 21:39   CT Head Wo Contrast  Result Date: 08/25/2022 CLINICAL DATA:  Status post fall. EXAM: CT HEAD WITHOUT CONTRAST TECHNIQUE: Contiguous axial images were obtained from the base of the skull through the vertex without intravenous contrast. RADIATION DOSE REDUCTION: This exam was performed according to the departmental dose-optimization program which includes automated  exposure control, adjustment of the mA and/or kV according to patient size and/or use of iterative reconstruction technique. COMPARISON:  August 15, 2022 FINDINGS: Brain: There is moderate severity cerebral atrophy with widening of the extra-axial spaces and ventricular dilatation. There are areas of decreased attenuation within the white matter tracts of the supratentorial brain, consistent with microvascular disease changes. Vascular: No hyperdense vessel or unexpected calcification. Skull: Normal. Negative for fracture or focal lesion. Sinuses/Orbits: No acute finding. Other: None. IMPRESSION: 1. No acute intracranial abnormality. 2. Moderate severity cerebral atrophy and microvascular disease changes of the supratentorial brain. Electronically Signed   By: Aram Candela M.D.   On: 08/25/2022 21:36   DG Wrist Complete Left  Result Date: 08/25/2022 CLINICAL DATA:  Fall. EXAM: LEFT WRIST - COMPLETE 3+ VIEW COMPARISON:  None Available. FINDINGS: There is soft tissue swelling surrounding the wrist. There is no acute fracture or dislocation identified. There is glenohumeral joint space narrowing there is osteophyte formation of the ulna. There is calcification of the triangular fibrocartilage. IMPRESSION: 1. No acute fracture or dislocation identified. 2. Soft tissue swelling. 3. Degenerative changes of the  wrist. 4. Chondrocalcinosis of the triangular fibrocartilage. Electronically Signed   By: Darliss Cheney M.D.   On: 08/25/2022 21:16   DG Humerus Left  Result Date: 08/25/2022 CLINICAL DATA:  Fall EXAM: LEFT HUMERUS - 2+ VIEW COMPARISON:  Chest x-ray 08/15/2022 FINDINGS: Subtle anatomic humeral neck fracture can not be entirely excluded. There are moderate degenerative changes of the glenohumeral joint with joint space narrowing and osteophyte formation. Soft tissues are within normal limits. IMPRESSION: 1. Subtle anatomic humeral neck fracture can not be entirely excluded. Consider dedicated views of  the left shoulder. 2. Moderate degenerative changes of the glenohumeral joint. Electronically Signed   By: Darliss Cheney M.D.   On: 08/25/2022 21:15   DG Elbow 2 Views Left  Result Date: 08/25/2022 CLINICAL DATA:  Trauma, fall EXAM: LEFT ELBOW - 2 VIEW COMPARISON:  None Available. FINDINGS: No displaced fracture or dislocation is seen. There is no displacement of posterior fat pad. IMPRESSION: No fracture or dislocation is seen in left elbow. Electronically Signed   By: Ernie Avena M.D.   On: 08/25/2022 21:14   DG Chest 2 View  Result Date: 08/25/2022 CLINICAL DATA:  Trauma, fall EXAM: CHEST - 2 VIEW COMPARISON:  08/15/2022 FINDINGS: Transverse diameter of heart is increased. Small linear densities are seen in medial right lower lung field. There are no signs of pulmonary edema or focal pulmonary consolidation. Degenerative changes are noted in both shoulders, more so on the left side. IMPRESSION: Cardiomegaly. There are no signs of pulmonary edema or focal pulmonary consolidation. Small linear densities in right lower lung fields suggest scarring or subsegmental atelectasis. Electronically Signed   By: Ernie Avena M.D.   On: 08/25/2022 21:14   DG Humerus Right  Result Date: 08/25/2022 CLINICAL DATA:  Fall EXAM: RIGHT HUMERUS - 2+ VIEW COMPARISON:  None Available. FINDINGS: There is no acute fracture or dislocation identified. There are mild degenerative changes of the acromioclavicular joint and glenohumeral joint with osteophyte formation. The soft tissues are within normal limits. IMPRESSION: 1. No acute fracture or dislocation. Electronically Signed   By: Darliss Cheney M.D.   On: 08/25/2022 21:11    Procedures Procedures   Medications Ordered in ED Medications  HYDROcodone-acetaminophen (NORCO/VICODIN) 5-325 MG per tablet 1 tablet (has no administration in time range)    ED Course/ Medical Decision Making/ A&P                           Medical Decision Making Amount  and/or Complexity of Data Reviewed Labs: ordered. Radiology: ordered.   This is a 86 year old male presenting with a left upper extremity fracture.  He reports that he fell today however this has been ongoing.  Differential for his fall includes but is not limited to his known bradycardia, arrhythmia, CVA, seizure, orthostasis, hypoglycemia and electrolyte abnormality.   This is not an exhaustive differential.    Past Medical History / Co-morbidities / Social History: HTN, bradycardia, Dementia?    Additional history: Per chart review patient presented to the ED on September 6 after a fall.  At that time his only complaint was back pain.  No signs of extremity injury.  At the end of September patient was admitted for bradycardia and AMS.  Suspected to have some dementia at that time.    Physical Exam: Pertinent physical exam findings include Bilateral shoulders TTP TTP over left humerus.  No obvious deformities.  Neurovascularly intact with a strong radial pulse Will raise  left arm with the assistance of his right, passive ROM intact  Lab Tests: I ordered, and personally interpreted labs.  The pertinent results include: Stable hemoglobin,    Imaging Studies: I ordered the following imaging CT head and neck: No acute finding Left elbow and wrist radiographs which were negative Left shoulder x-ray which showed no abnormalities that were noted on his outpatient scan brought with him Right humeral x-ray negative  CT shoulder pending    Medications: I ordered medication including vicodin.   MDM/Disposition: This is a 86 year old male presenting after a questionable fall.  Complaining of left upper extremity discomfort.  He comes in with an x-ray reporting of an impacted left humeral head fracture.  No photos just the read by his facility LPN.  Patient does have tenderness over the left shoulder and humerus.  X-ray imaging was negative so CT shoulder was pursued.  Patient signed  out to oncoming provider, Evlyn Courier.  Please see his note for the remainder of patient's work-up.  CT scan is pending and he will dispo accordingly.  I anticipate that if this is negative for fracture/dislocatiton then patient will be discharged home to his SNF.     I discussed this case with my attending physician Dr. Truett Mainland who cosigned this note including patient's presenting symptoms, physical exam, and planned diagnostics and interventions. Attending physician stated agreement with plan or made changes to plan which were implemented.     Final Clinical Impression(s) / ED Diagnoses Final diagnoses:  None    Rx / DC Orders ED Discharge Orders     None         Sumie Remsen, Cecilio Asper, PA-C 08/25/22 2335    Cristie Hem, MD 08/26/22 1341

## 2022-08-25 NOTE — ED Notes (Signed)
The pt has episodes of talking then does not speak the next time he  he's asked questions  attempts x 3 to start an iv  he pulled away all three pulling the  angiocath out  unable to start an iv

## 2022-08-25 NOTE — ED Triage Notes (Signed)
The pt was sent from [facility] he arrived by [facility] the pt is c/o lt upper arm pain  he fell earlier  but accirding to [facility]  the pt was there for rehab from a fractured humerus in the past anyway    unknown if new or old

## 2022-08-25 NOTE — ED Notes (Signed)
To x-ray

## 2022-08-26 MED ORDER — PREDNISONE 10 MG (21) PO TBPK: ORAL_TABLET | Freq: Every day | ORAL | 0 refills | Status: AC

## 2022-08-26 NOTE — ED Notes (Signed)
The pt refused pain med

## 2022-08-26 NOTE — ED Provider Notes (Signed)
Signout received on this 86 year old male who presented from [facility] rehab facility for abnormal left humerus x-ray.  CT scan of the left shoulder done without evidence of acute fracture of the neck of the humerus.  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  Exam and work-up without concern for septic joint at this time.  Will place patient on prednisone Dosepak, provide sling and Ortho follow-up.  He is appropriate for discharge.  Physical Exam  BP (!) 129/94   Pulse 77   Temp 98.7 F (37.1 C) (Oral)   Resp 18   Ht 5\' 8"  (1.727 m)   Wt 66 kg   SpO2 100%   BMI 22.12 kg/m     Procedures  Procedures  ED Course / MDM    Medical Decision Making Amount and/or Complexity of Data Reviewed Labs: ordered. Radiology: ordered.  Risk Prescription drug management.   I did call patient's rehab facility and provided report to nurse including plan.  Medication sent to facilities pharmacy of choice.  Patient discussed with attending.       Evlyn Courier, PA-C 08/26/22 0156    Jeanell Sparrow, DO 08/28/22 [phone]

## 2022-08-26 NOTE — Discharge Instructions (Addendum)
Your work-up today is overall reassuring.  No evidence of fracture on the CT scan.  There was a joint effusion.  No signs or symptoms concerning for septic joint.  I have sent prednisone Dosepak into the pharmacy for you.  You were provided with a sling and orthopedic follow-up.  For any concerning symptoms please return to the emergency room.  Otherwise please call and schedule a follow-up appointment with the orthopedist.

## 2022-08-27 ENCOUNTER — Ambulatory Visit: Payer: Self-pay | Admitting: Nurse Practitioner

## 2022-09-10 ENCOUNTER — Other Ambulatory Visit (HOSPITAL_COMMUNITY): Payer: Self-pay

## 2022-09-10 MED ORDER — TAMSULOSIN HCL 0.4 MG PO CAPS
0.4000 mg | ORAL_CAPSULE | Freq: Every day | ORAL | 0 refills | Status: AC
  Filled 2022-09-10: qty 30, 30d supply, fill #0

## 2023-03-23 DEATH — deceased
